# Patient Record
Sex: Female | Born: 1980 | Race: White | Hispanic: No | Marital: Married | Smoking: Never smoker
Health system: Southern US, Community
[De-identification: ages and names within clinical notes are randomized; demographics above are authoritative.]

## PROBLEM LIST (undated history)

## (undated) ENCOUNTER — Inpatient Hospital Stay (HOSPITAL_COMMUNITY): Payer: Self-pay

## (undated) DIAGNOSIS — O99280 Endocrine, nutritional and metabolic diseases complicating pregnancy, unspecified trimester: Secondary | ICD-10-CM

## (undated) DIAGNOSIS — J0100 Acute maxillary sinusitis, unspecified: Secondary | ICD-10-CM

## (undated) DIAGNOSIS — J45909 Unspecified asthma, uncomplicated: Secondary | ICD-10-CM

## (undated) DIAGNOSIS — O34219 Maternal care for unspecified type scar from previous cesarean delivery: Secondary | ICD-10-CM

## (undated) DIAGNOSIS — N979 Female infertility, unspecified: Secondary | ICD-10-CM

## (undated) DIAGNOSIS — I639 Cerebral infarction, unspecified: Secondary | ICD-10-CM

## (undated) DIAGNOSIS — IMO0001 Reserved for inherently not codable concepts without codable children: Secondary | ICD-10-CM

## (undated) DIAGNOSIS — E039 Hypothyroidism, unspecified: Secondary | ICD-10-CM

## (undated) HISTORY — DX: Cerebral infarction, unspecified: I63.9

## (undated) HISTORY — PX: WISDOM TOOTH EXTRACTION: SHX21

## (undated) HISTORY — PX: HAND SURGERY: SHX662

---

## 1999-07-27 ENCOUNTER — Encounter (INDEPENDENT_AMBULATORY_CARE_PROVIDER_SITE_OTHER): Payer: Self-pay | Admitting: *Deleted

## 1999-07-27 ENCOUNTER — Ambulatory Visit (HOSPITAL_BASED_OUTPATIENT_CLINIC_OR_DEPARTMENT_OTHER): Admission: RE | Admit: 1999-07-27 | Discharge: 1999-07-27 | Payer: Self-pay | Admitting: Orthopedic Surgery

## 1999-08-25 ENCOUNTER — Other Ambulatory Visit: Admission: RE | Admit: 1999-08-25 | Discharge: 1999-08-25 | Payer: Self-pay | Admitting: Obstetrics and Gynecology

## 2000-01-20 ENCOUNTER — Emergency Department (HOSPITAL_COMMUNITY): Admission: EM | Admit: 2000-01-20 | Discharge: 2000-01-20 | Payer: Self-pay

## 2000-10-15 ENCOUNTER — Other Ambulatory Visit: Admission: RE | Admit: 2000-10-15 | Discharge: 2000-10-15 | Payer: Self-pay | Admitting: Obstetrics and Gynecology

## 2001-09-02 ENCOUNTER — Other Ambulatory Visit: Admission: RE | Admit: 2001-09-02 | Discharge: 2001-09-02 | Payer: Self-pay | Admitting: Obstetrics and Gynecology

## 2002-09-19 ENCOUNTER — Other Ambulatory Visit: Admission: RE | Admit: 2002-09-19 | Discharge: 2002-09-19 | Payer: Self-pay | Admitting: Obstetrics and Gynecology

## 2003-10-07 ENCOUNTER — Other Ambulatory Visit: Admission: RE | Admit: 2003-10-07 | Discharge: 2003-10-07 | Payer: Self-pay | Admitting: Obstetrics and Gynecology

## 2004-11-11 ENCOUNTER — Other Ambulatory Visit: Admission: RE | Admit: 2004-11-11 | Discharge: 2004-11-11 | Payer: Self-pay | Admitting: Obstetrics and Gynecology

## 2005-12-22 ENCOUNTER — Other Ambulatory Visit: Admission: RE | Admit: 2005-12-22 | Discharge: 2005-12-22 | Payer: Self-pay | Admitting: Obstetrics and Gynecology

## 2007-11-07 ENCOUNTER — Ambulatory Visit (HOSPITAL_COMMUNITY): Admission: RE | Admit: 2007-11-07 | Discharge: 2007-11-07 | Payer: Self-pay | Admitting: Obstetrics & Gynecology

## 2008-05-01 ENCOUNTER — Observation Stay (HOSPITAL_COMMUNITY): Admission: AD | Admit: 2008-05-01 | Discharge: 2008-05-02 | Payer: Self-pay | Admitting: Obstetrics & Gynecology

## 2008-05-02 ENCOUNTER — Encounter (INDEPENDENT_AMBULATORY_CARE_PROVIDER_SITE_OTHER): Payer: Self-pay | Admitting: Obstetrics & Gynecology

## 2009-05-27 ENCOUNTER — Inpatient Hospital Stay (HOSPITAL_COMMUNITY): Admission: AD | Admit: 2009-05-27 | Discharge: 2009-05-27 | Payer: Self-pay | Admitting: Obstetrics and Gynecology

## 2009-06-04 ENCOUNTER — Inpatient Hospital Stay (HOSPITAL_COMMUNITY): Admission: RE | Admit: 2009-06-04 | Discharge: 2009-06-07 | Payer: Self-pay | Admitting: Obstetrics & Gynecology

## 2009-06-10 ENCOUNTER — Ambulatory Visit: Payer: Self-pay | Admitting: Pediatrics

## 2010-07-13 LAB — CROSSMATCH
ABO/RH(D): O POS
Antibody Screen: NEGATIVE

## 2010-07-13 LAB — CBC
MCHC: 33.9 g/dL (ref 30.0–36.0)
Platelets: 142 10*3/uL — ABNORMAL LOW (ref 150–400)
Platelets: 193 10*3/uL (ref 150–400)
RBC: 3.26 MIL/uL — ABNORMAL LOW (ref 3.87–5.11)
RDW: 15 % (ref 11.5–15.5)
WBC: 9.9 10*3/uL (ref 4.0–10.5)

## 2010-07-13 LAB — RPR: RPR Ser Ql: NONREACTIVE

## 2010-07-13 LAB — ABO/RH: ABO/RH(D): O POS

## 2010-08-08 LAB — LUPUS ANTICOAGULANT PANEL: Lupus Anticoagulant: NOT DETECTED

## 2010-08-08 LAB — ANTIPHOSPHOLIPID SYNDROME EVAL, BLD
Anticardiolipin IgG: <7 [GPL'U] — ABNORMAL LOW (ref <11)
Anticardiolipin IgM: <7 [MPL'U] — ABNORMAL LOW (ref <10)
Antiphosphatidylserine IgA: <20 APS U/mL (ref <20.0)
Antiphosphatidylserine IgG: <11 GPS U/mL (ref <11.0)
Lupus Anticoagulant: NOT DETECTED

## 2010-08-08 LAB — RPR: RPR Ser Ql: NONREACTIVE

## 2010-08-08 LAB — TORCH-IGM(TOXO/ RUB/ CMV/ HSV) W TITER
CMV IgM: 0.71 IV
Toxoplasma IgM: 0.34 IV

## 2010-08-08 LAB — CBC
HCT: 31.6 % — ABNORMAL LOW (ref 36.0–46.0)
MCV: 94.4 fL (ref 78.0–100.0)
Platelets: 250 10*3/uL (ref 150–400)
RDW: 12.9 % (ref 11.5–15.5)
WBC: 8.9 10*3/uL (ref 4.0–10.5)

## 2010-08-08 LAB — ANA: Anti Nuclear Antibody(ANA): POSITIVE — AB

## 2010-08-08 LAB — ANTI-NUCLEAR AB-TITER (ANA TITER): ANA Titer 1: NEGATIVE

## 2010-08-08 LAB — TISSUE HYBRIDIZATION TO NCBH

## 2010-09-09 NOTE — Op Note (Signed)
[place]. [facility]  Patient:    Krista Welch                       MRN: [id number] Proc. Date: 07/27/99 Adm. Date:  [id number] Attending:  Marlowe Shores                           Operative Report  PREOPERATIVE DIAGNOSIS:  Dorsal mass, right hand.  POSTOPERATIVE DIAGNOSIS:  Dorsal mass, right hand.  OPERATION:  Excision ganglion cyst, dorsal aspect of the right hand from the extensor tendon of the ring finger extensor digitorum communis.  SURGEON:  Artist Pais. Mina Marble, M.D.  ASSISTANT:  Junius Roads. Ireton, P.A.C.  ANESTHESIA:  Bier block.  TOURNIQUET TIME:  30 minutes.  COMPLICATIONS:  None.  DRAINS:  None.  SPECIMENS:  1 specimen sent.  DESCRIPTION OF PROCEDURE:  The patient was taken to the operating room, and after the induction of adequate bier block analgesia, the right upper extremity was prepped and draped in the usual sterile fashion.   Once this was done, a 2 cm transverse incision was made over a subcutaneous mass over the dorsal ulnar aspect of the right hand in the area of the fourth metacarpal ray.  The incision was taken down through the skin and subcutaneous tissues with care to carefully identify and retract small veins and branch of ulnar cutaneous nerve.  Once this was done, the cyst was encountered.  The cyst was actually involving the [facility] tendon to the ring finger primarily.  It was ______ .  Careful dissection was carried down into the core of the tendon, and the cyst itself was excised.  There was a small longitudinal rent in the tendon itself but was deemed nonrepairable and quite stable.  The wound was then thoroughly irrigated.  Hemostasis was achieved with  bipolar cautery, and the wound was then closed with running 3-0 Prolene subcuticular stitch. Xeroform, 4x4 fluffs, and a compressive dressing as well as a volar splint was applied.  The patient tolerated the procedure well and went to the recovery room  in stable fashion. DD:  07/27/99 TD:  07/27/99 Job: [id number]

## 2011-04-25 HISTORY — DX: Maternal care for unspecified type scar from previous cesarean delivery: O34.219

## 2011-04-25 NOTE — L&D Delivery Note (Signed)
Delivery Note At 10:43 AM with controlled pushing a  viable and healthy female was delivered via VBAC, Spontaneous (Presentation: ; Occiput Posterior).  APGAR: 9, 9; weight 8 lb 0.8 oz (3650 g).   Placenta status: Intact, Spontaneous.  Cord: 3 vessels with the following complications: None.  Cord pH: N/A.   Anesthesia: Epidural  Episiotomy: None Lacerations: Bilateral vaginal sulcal tears and bilateral labial tears and 2nd degree perineal laceration.  Suture Repair: 2.0 3.0 vicryl rapide Est. Blood Loss (mL): 400  Mom to postpartum.  Baby to with mother.  Jefrey Raburn R 01/23/2012, 12:13 PM

## 2011-07-13 LAB — OB RESULTS CONSOLE RUBELLA ANTIBODY, IGM: Rubella: IMMUNE

## 2011-07-13 LAB — OB RESULTS CONSOLE ABO/RH: RH Type: POSITIVE

## 2011-07-13 LAB — OB RESULTS CONSOLE HEPATITIS B SURFACE ANTIGEN: Hepatitis B Surface Ag: NEGATIVE

## 2011-11-30 ENCOUNTER — Inpatient Hospital Stay (HOSPITAL_COMMUNITY): Payer: BC Managed Care – PPO

## 2011-11-30 ENCOUNTER — Inpatient Hospital Stay (HOSPITAL_COMMUNITY)
Admission: AD | Admit: 2011-11-30 | Discharge: 2011-12-01 | Disposition: A | Discharge status: Home / Self Care | Payer: BC Managed Care – PPO | Source: Ambulatory Visit | Attending: Obstetrics & Gynecology | Admitting: Obstetrics & Gynecology

## 2011-11-30 ENCOUNTER — Encounter (HOSPITAL_COMMUNITY): Payer: Self-pay | Admitting: *Deleted

## 2011-11-30 DIAGNOSIS — R52 Pain, unspecified: Secondary | ICD-10-CM

## 2011-11-30 DIAGNOSIS — O4700 False labor before 37 completed weeks of gestation, unspecified trimester: Secondary | ICD-10-CM | POA: Insufficient documentation

## 2011-11-30 MED ORDER — DEXTROSE 5 % IN LACTATED RINGERS IV BOLUS: 500.0000 mL | Freq: Once | INTRAVENOUS | Status: DC

## 2011-11-30 MED ORDER — LACTATED RINGERS IV SOLN
Freq: Once | INTRAVENOUS | Status: AC
  Administered 2011-11-30: 22:00:00 via INTRAVENOUS

## 2011-11-30 MED ORDER — TERBUTALINE SULFATE 1 MG/ML IJ SOLN
0.2500 mg | Freq: Once | INTRAMUSCULAR | Status: AC
  Administered 2011-11-30: 0.25 mg via SUBCUTANEOUS
  Filled 2011-11-30: qty 1

## 2011-11-30 NOTE — MAU Note (Signed)
Pt G3 P1 at 30.2wks having contractions. Denies bleeding or leaking fluid.

## 2011-11-30 NOTE — MAU Provider Note (Addendum)
  History    CSN: [id number]  Arrival date and time: 11/30/11 2103   None    Chief Complaint  Patient presents with  . Contractions   HPI 31 yo, [id number] (twin loss at 19 wks, 1 SAB, then term c-s for previa), IVF pregnancy, 1st trim bleeding and SCH, no problems since then.  C/o uterine contraction not improved with rest and hydration started after almost fell at home, but caught fall. No vaginal leaking/ bleeding/ pressure. Good FMs.   Past Medical History  Diagnosis Date  . No pertinent past medical history   . Gestational diabetes    Past Surgical History  Procedure Date  . Cesarean section   . Hand surgery    No family history on file.  History  Substance Use Topics  . Smoking status: Not on file  . Smokeless tobacco: Not on file  . Alcohol Use:     Allergies: No Known Allergies  Prescriptions prior to admission  Medication Sig Dispense Refill  . cetirizine (ZYRTEC) 10 MG tablet Take 10 mg by mouth daily.      Marland Kitchen levothyroxine (SYNTHROID, LEVOTHROID) 25 MCG tablet Take 25 mcg by mouth every morning.      . Prenatal Vit-Fe Fumarate-FA (PRENATAL MULTIVITAMIN) TABS Take 1 tablet by mouth at bedtime.        ROS Physical Exam   Blood pressure 122/65, pulse 82, temperature 97.9 F (36.6 C), temperature source Oral, resp. rate 18, height 5\' 4"  (1.626 m), weight 174 lb 6.4 oz (79.107 kg).  Physical Exam A&O x 3, no acute distress. Pleasant HEENT neg, no thyromegaly Lungs CTA bilat CV RRR, S1S2 normal Abdo soft, non tender, non acute Extr no edema/ tenderness Pelvic Cx closed, long FHT  140s/ + accels/ no decels/ variab minimal, ok for 30 wks.  Toco noted at admission, resolved since terb inj  MAU Course  Procedures Sono - Cervical length - nl 4.9 cm, no funeling.   Assessment and Plan  30.2/7 wks, preterm uterine contractions. IV fluids, terbutaline x1 dose.  Sono cervical length - nl F/up office next wk, PTL precautions, Procardia prn.   Krista Welch  R 11/30/2011, 11:24 PM

## 2011-11-30 NOTE — MAU Note (Signed)
Patient denies contractions at this time, taken to ultrasound via wheelchair

## 2011-12-01 MED ORDER — NIFEDIPINE 10 MG PO CAPS: 10.0000 mg | ORAL_CAPSULE | Freq: Three times a day (TID) | ORAL | Status: DC

## 2011-12-06 ENCOUNTER — Encounter: Payer: BC Managed Care – PPO | Attending: Obstetrics & Gynecology | Admitting: *Deleted

## 2011-12-06 VITALS — Ht 65.0 in | Wt 174.5 lb

## 2011-12-06 DIAGNOSIS — O24419 Gestational diabetes mellitus in pregnancy, unspecified control: Secondary | ICD-10-CM

## 2011-12-06 DIAGNOSIS — O99810 Abnormal glucose complicating pregnancy: Secondary | ICD-10-CM | POA: Insufficient documentation

## 2011-12-06 DIAGNOSIS — Z713 Dietary counseling and surveillance: Secondary | ICD-10-CM | POA: Insufficient documentation

## 2011-12-07 ENCOUNTER — Encounter: Payer: Self-pay | Admitting: *Deleted

## 2011-12-07 NOTE — Patient Instructions (Signed)
Goals:  Check glucose levels per MD as instructed  Follow Gestational Diabetes Diet as instructed  Call for follow-up as needed    

## 2011-12-07 NOTE — Progress Notes (Signed)
  Patient was seen on 12/06/11 for Gestational Diabetes self-management class at the [facility]. The following learning objectives were met by the patient during this course:   States the definition of Gestational Diabetes  States why dietary management is important in controlling blood glucose  Describes the effects each nutrient has on blood glucose levels  Demonstrates ability to create a balanced meal plan  Demonstrates carbohydrate counting   States when to check blood glucose levels  Demonstrates proper blood glucose monitoring techniques  States the effect of stress and exercise on blood glucose levels  States the importance of limiting caffeine and abstaining from alcohol and smoking  Blood glucose monitor given:  One Touch Ultra Mini Self Monitoring Kit Lot # [id number] Exp: 4/14 Blood glucose reading: 111 mg/dl  Patient instructed to monitor glucose levels: FBS: 60 - <90 2 hour: <120  *Patient received handouts:  Nutrition Diabetes and Pregnancy  Carbohydrate Counting List  Patient will be seen for follow-up as needed.   

## 2012-01-01 LAB — OB RESULTS CONSOLE GBS: GBS: NEGATIVE

## 2012-01-23 ENCOUNTER — Inpatient Hospital Stay (HOSPITAL_COMMUNITY)
Admission: AD | Admit: 2012-01-23 | Discharge: 2012-01-24 | DRG: 372 | Disposition: A | Discharge status: Home / Self Care | Payer: BC Managed Care – PPO | Source: Ambulatory Visit | Attending: Obstetrics & Gynecology | Admitting: Obstetrics & Gynecology

## 2012-01-23 ENCOUNTER — Encounter (HOSPITAL_COMMUNITY): Payer: Self-pay | Admitting: *Deleted

## 2012-01-23 ENCOUNTER — Encounter (HOSPITAL_COMMUNITY): Payer: Self-pay | Admitting: Anesthesiology

## 2012-01-23 ENCOUNTER — Inpatient Hospital Stay (HOSPITAL_COMMUNITY): Payer: BC Managed Care – PPO | Admitting: Anesthesiology

## 2012-01-23 DIAGNOSIS — O24410 Gestational diabetes mellitus in pregnancy, diet controlled: Secondary | ICD-10-CM | POA: Diagnosis present

## 2012-01-23 DIAGNOSIS — E079 Disorder of thyroid, unspecified: Secondary | ICD-10-CM | POA: Diagnosis present

## 2012-01-23 DIAGNOSIS — O99280 Endocrine, nutritional and metabolic diseases complicating pregnancy, unspecified trimester: Secondary | ICD-10-CM

## 2012-01-23 DIAGNOSIS — IMO0001 Reserved for inherently not codable concepts without codable children: Secondary | ICD-10-CM

## 2012-01-23 DIAGNOSIS — E039 Hypothyroidism, unspecified: Secondary | ICD-10-CM

## 2012-01-23 DIAGNOSIS — O99814 Abnormal glucose complicating childbirth: Secondary | ICD-10-CM | POA: Diagnosis present

## 2012-01-23 DIAGNOSIS — O3660X0 Maternal care for excessive fetal growth, unspecified trimester, not applicable or unspecified: Secondary | ICD-10-CM | POA: Diagnosis present

## 2012-01-23 DIAGNOSIS — O34219 Maternal care for unspecified type scar from previous cesarean delivery: Secondary | ICD-10-CM | POA: Diagnosis present

## 2012-01-23 HISTORY — DX: Reserved for inherently not codable concepts without codable children: IMO0001

## 2012-01-23 HISTORY — DX: Female infertility, unspecified: N97.9

## 2012-01-23 HISTORY — DX: Acute maxillary sinusitis, unspecified: J01.00

## 2012-01-23 HISTORY — DX: Hypothyroidism, unspecified: E03.9

## 2012-01-23 HISTORY — DX: Endocrine, nutritional and metabolic diseases complicating pregnancy, unspecified trimester: O99.280

## 2012-01-23 HISTORY — DX: Unspecified asthma, uncomplicated: J45.909

## 2012-01-23 HISTORY — DX: Hypothyroidism, unspecified: O99.280

## 2012-01-23 LAB — CBC
MCH: 31.1 pg (ref 26.0–34.0)
MCV: 90.7 fL (ref 78.0–100.0)
Platelets: 160 10*3/uL (ref 150–400)
RDW: 13.9 % (ref 11.5–15.5)
WBC: 11 10*3/uL — ABNORMAL HIGH (ref 4.0–10.5)

## 2012-01-23 LAB — GLUCOSE, CAPILLARY: Glucose-Capillary: 75 mg/dL (ref 70–99)

## 2012-01-23 LAB — TYPE AND SCREEN: ABO/RH(D): O POS

## 2012-01-23 MED ORDER — DIPHENHYDRAMINE HCL 25 MG PO CAPS: 25.0000 mg | ORAL_CAPSULE | Freq: Four times a day (QID) | ORAL | Status: DC | PRN

## 2012-01-23 MED ORDER — OXYTOCIN 40 UNITS IN LACTATED RINGERS INFUSION - SIMPLE MED
1.0000 m[IU]/min | INTRAVENOUS | Status: DC
  Administered 2012-01-23: 2 m[IU]/min via INTRAVENOUS
  Administered 2012-01-23: 666 m[IU]/min via INTRAVENOUS
  Filled 2012-01-23: qty 1000

## 2012-01-23 MED ORDER — LACTATED RINGERS IV SOLN: INTRAVENOUS | Status: DC

## 2012-01-23 MED ORDER — IBUPROFEN 600 MG PO TABS: 600.0000 mg | ORAL_TABLET | Freq: Four times a day (QID) | ORAL | Status: DC | PRN

## 2012-01-23 MED ORDER — LEVOTHYROXINE SODIUM 25 MCG PO TABS
25.0000 ug | ORAL_TABLET | Freq: Every morning | ORAL | Status: DC
  Administered 2012-01-24: 25 ug via ORAL
  Filled 2012-01-23 (×3): qty 1

## 2012-01-23 MED ORDER — SENNOSIDES-DOCUSATE SODIUM 8.6-50 MG PO TABS
2.0000 | ORAL_TABLET | Freq: Every day | ORAL | Status: DC
  Administered 2012-01-23: 2 via ORAL

## 2012-01-23 MED ORDER — CITRIC ACID-SODIUM CITRATE 334-500 MG/5ML PO SOLN: 30.0000 mL | ORAL | Status: DC | PRN

## 2012-01-23 MED ORDER — WITCH HAZEL-GLYCERIN EX PADS
1.0000 "application " | MEDICATED_PAD | CUTANEOUS | Status: DC | PRN
  Administered 2012-01-23: 1 via TOPICAL

## 2012-01-23 MED ORDER — FLEET ENEMA 7-19 GM/118ML RE ENEM: 1.0000 | ENEMA | Freq: Once | RECTAL | Status: DC

## 2012-01-23 MED ORDER — ACETAMINOPHEN 325 MG PO TABS: 650.0000 mg | ORAL_TABLET | ORAL | Status: DC | PRN

## 2012-01-23 MED ORDER — ZOLPIDEM TARTRATE 5 MG PO TABS: 5.0000 mg | ORAL_TABLET | Freq: Every evening | ORAL | Status: DC | PRN

## 2012-01-23 MED ORDER — PRENATAL MULTIVITAMIN CH
1.0000 | ORAL_TABLET | Freq: Every day | ORAL | Status: DC
  Administered 2012-01-24: 1 via ORAL
  Filled 2012-01-23: qty 1

## 2012-01-23 MED ORDER — ONDANSETRON HCL 4 MG/2ML IJ SOLN: 4.0000 mg | Freq: Four times a day (QID) | INTRAMUSCULAR | Status: DC | PRN

## 2012-01-23 MED ORDER — LIDOCAINE HCL (PF) 1 % IJ SOLN
INTRAMUSCULAR | Status: DC | PRN
  Administered 2012-01-23 (×2): 4 mL
  Administered 2012-01-23: 30 mL

## 2012-01-23 MED ORDER — TERBUTALINE SULFATE 1 MG/ML IJ SOLN: 0.2500 mg | Freq: Once | INTRAMUSCULAR | Status: DC | PRN

## 2012-01-23 MED ORDER — OXYCODONE-ACETAMINOPHEN 5-325 MG PO TABS
1.0000 | ORAL_TABLET | ORAL | Status: DC | PRN
  Administered 2012-01-23 (×2): 1 via ORAL
  Administered 2012-01-24: 2 via ORAL
  Filled 2012-01-23: qty 1
  Filled 2012-01-23: qty 2
  Filled 2012-01-23: qty 1

## 2012-01-23 MED ORDER — BENZOCAINE-MENTHOL 20-0.5 % EX AERO
1.0000 "application " | INHALATION_SPRAY | CUTANEOUS | Status: DC | PRN
  Administered 2012-01-23: 1 via TOPICAL
  Filled 2012-01-23: qty 56

## 2012-01-23 MED ORDER — ONDANSETRON HCL 4 MG PO TABS: 4.0000 mg | ORAL_TABLET | ORAL | Status: DC | PRN

## 2012-01-23 MED ORDER — OXYTOCIN 40 UNITS IN LACTATED RINGERS INFUSION - SIMPLE MED: 62.5000 mL/h | Freq: Once | INTRAVENOUS | Status: DC

## 2012-01-23 MED ORDER — OXYTOCIN BOLUS FROM INFUSION
500.0000 mL | Freq: Once | INTRAVENOUS | Status: DC
  Filled 2012-01-23: qty 500

## 2012-01-23 MED ORDER — IBUPROFEN 600 MG PO TABS
600.0000 mg | ORAL_TABLET | Freq: Four times a day (QID) | ORAL | Status: DC | PRN
  Administered 2012-01-23: 600 mg via ORAL
  Filled 2012-01-23: qty 1

## 2012-01-23 MED ORDER — LIDOCAINE HCL (PF) 1 % IJ SOLN
30.0000 mL | INTRAMUSCULAR | Status: DC | PRN
  Filled 2012-01-23: qty 30

## 2012-01-23 MED ORDER — LIDOCAINE HCL (PF) 1 % IJ SOLN: 30.0000 mL | INTRAMUSCULAR | Status: DC | PRN

## 2012-01-23 MED ORDER — DIPHENHYDRAMINE HCL 50 MG/ML IJ SOLN: 12.5000 mg | INTRAMUSCULAR | Status: DC | PRN

## 2012-01-23 MED ORDER — LACTATED RINGERS IV SOLN
500.0000 mL | Freq: Once | INTRAVENOUS | Status: AC
  Administered 2012-01-23: 500 mL via INTRAVENOUS

## 2012-01-23 MED ORDER — EPHEDRINE 5 MG/ML INJ
10.0000 mg | INTRAVENOUS | Status: DC | PRN
  Filled 2012-01-23: qty 4

## 2012-01-23 MED ORDER — OXYCODONE-ACETAMINOPHEN 5-325 MG PO TABS: 1.0000 | ORAL_TABLET | ORAL | Status: DC | PRN

## 2012-01-23 MED ORDER — PHENYLEPHRINE 40 MCG/ML (10ML) SYRINGE FOR IV PUSH (FOR BLOOD PRESSURE SUPPORT)
80.0000 ug | PREFILLED_SYRINGE | INTRAVENOUS | Status: DC | PRN
  Filled 2012-01-23: qty 5

## 2012-01-23 MED ORDER — LANOLIN HYDROUS EX OINT: TOPICAL_OINTMENT | CUTANEOUS | Status: DC | PRN

## 2012-01-23 MED ORDER — ONDANSETRON HCL 4 MG/2ML IJ SOLN: 4.0000 mg | INTRAMUSCULAR | Status: DC | PRN

## 2012-01-23 MED ORDER — FENTANYL 2.5 MCG/ML BUPIVACAINE 1/10 % EPIDURAL INFUSION (WH - ANES)
14.0000 mL/h | INTRAMUSCULAR | Status: DC
  Administered 2012-01-23 (×2): 14 mL/h via EPIDURAL
  Filled 2012-01-23 (×3): qty 60

## 2012-01-23 MED ORDER — TETANUS-DIPHTH-ACELL PERTUSSIS 5-2.5-18.5 LF-MCG/0.5 IM SUSP
0.5000 mL | Freq: Once | INTRAMUSCULAR | Status: AC
  Administered 2012-01-24: 0.5 mL via INTRAMUSCULAR
  Filled 2012-01-23: qty 0.5

## 2012-01-23 MED ORDER — LACTATED RINGERS IV SOLN
INTRAVENOUS | Status: DC
  Administered 2012-01-23: 09:00:00 via INTRAVENOUS

## 2012-01-23 MED ORDER — SIMETHICONE 80 MG PO CHEW: 80.0000 mg | CHEWABLE_TABLET | ORAL | Status: DC | PRN

## 2012-01-23 MED ORDER — DIBUCAINE 1 % RE OINT
1.0000 "application " | TOPICAL_OINTMENT | RECTAL | Status: DC | PRN
  Administered 2012-01-23: 1 via RECTAL
  Filled 2012-01-23: qty 28

## 2012-01-23 MED ORDER — EPHEDRINE 5 MG/ML INJ: 10.0000 mg | INTRAVENOUS | Status: DC | PRN

## 2012-01-23 MED ORDER — LORATADINE 10 MG PO TABS
10.0000 mg | ORAL_TABLET | Freq: Every day | ORAL | Status: DC
Start: 2012-01-23 — End: 2012-01-24
  Administered 2012-01-24: 10 mg via ORAL
  Filled 2012-01-23 (×3): qty 1

## 2012-01-23 MED ORDER — LACTATED RINGERS IV SOLN: 500.0000 mL | INTRAVENOUS | Status: DC | PRN

## 2012-01-23 MED ORDER — PHENYLEPHRINE 40 MCG/ML (10ML) SYRINGE FOR IV PUSH (FOR BLOOD PRESSURE SUPPORT): 80.0000 ug | PREFILLED_SYRINGE | INTRAVENOUS | Status: DC | PRN

## 2012-01-23 MED ORDER — IBUPROFEN 600 MG PO TABS
600.0000 mg | ORAL_TABLET | Freq: Four times a day (QID) | ORAL | Status: DC
  Administered 2012-01-23 – 2012-01-24 (×4): 600 mg via ORAL
  Filled 2012-01-23 (×4): qty 1

## 2012-01-23 MED ORDER — FENTANYL 2.5 MCG/ML BUPIVACAINE 1/10 % EPIDURAL INFUSION (WH - ANES)
INTRAMUSCULAR | Status: DC | PRN
  Administered 2012-01-23: 14 mL/h via EPIDURAL

## 2012-01-23 NOTE — Anesthesia Postprocedure Evaluation (Signed)
  Anesthesia Post-op Note  Patient: Krista Welch  Procedure(s) Performed: * No procedures listed *  Patient Location: Mother/Baby  Anesthesia Type: Epidural  Level of Consciousness: awake, alert  and oriented  Airway and Oxygen Therapy: Patient Spontanous Breathing  Post-op Pain: none  Post-op Assessment: Post-op Vital signs reviewed and Patient's Cardiovascular Status Stable  Post-op Vital Signs: Reviewed and stable  Complications: No apparent anesthesia complications

## 2012-01-23 NOTE — Progress Notes (Signed)
Krista Welch is a 31 y.o. G4P1 at [redacted]w[redacted]d by LMP c/w 1st trim sono. Presenting with contractions and slight bleeding. No fluid leaking. Good FMs.  ROS neg S/p Epidural, is comfortable.   Objective: BP 113/65  Pulse 66  Temp 97.5 F (36.4 C) (Oral)  Resp 20  Ht 5\' 4"  (1.626 m)  Wt 182 lb (82.555 kg)  BMI 31.24 kg/m2  SpO2 97%   FHT:  FHR: 140s bpm, variability: moderate,  accelerations:  Present,  decelerations:  Absent UC:   irregular, every 3-5 minutes SVE: 3/70%/-3/ Vtx. Controlled AROM with fundal pressure and small amniotomy performed. Small amount of clear fluid and bloody show noted.   Labs: Lab Results  Component Value Date   WBC 11.0* 01/23/2012   HGB 12.7 01/23/2012   HCT 37.0 01/23/2012   MCV 90.7 01/23/2012   PLT 160 01/23/2012    Assessment / Plan:  Labor: Spontaneous early labor, contractions spaced out. Reassess pattern with AROM and start low dose pitocin if needed.  Understands slightly higher risk of uterine scar rupture with labor augmentation from 0.5% to 1-2%. Agrees and prefers VTOL.  VTOL desired with not prior term delivery but 19 wk twin loss after 1st c/s. High station with ballotable head is concerning but continue to monitor progress.  FHT- category I GBS neg.  Krista Welch R 01/23/2012, 6:38 AM

## 2012-01-23 NOTE — MAU Note (Signed)
Contractions, denies bleeding or ROM 

## 2012-01-23 NOTE — Progress Notes (Signed)
Krista Welch is a 31 y.o. G4P1 at [redacted]w[redacted]d, spontaneous labor this morning, but UCs spaced out after epidural and AROM has not helped with spontaneous contractions.  FHT category I. Pt feels well, plan to start low dose pitocin to assist labor augmentation. Agrees.    Krista Welch 01/23/2012, 10:10 AM

## 2012-01-23 NOTE — Anesthesia Procedure Notes (Signed)
Epidural Patient location during procedure: OB Start time: 01/23/2012 3:38 AM  Staffing Anesthesiologist: Brena Windsor A. Performed by: anesthesiologist   Preanesthetic Checklist Completed: patient identified, site marked, surgical consent, pre-op evaluation, timeout performed, IV checked, risks and benefits discussed and monitors and equipment checked  Epidural Patient position: sitting Prep: site prepped and draped and DuraPrep Patient monitoring: continuous pulse ox and blood pressure Approach: midline Injection technique: LOR air  Needle:  Needle type: Tuohy  Needle gauge: 17 G Needle length: 9 cm and 9 Needle insertion depth: 5 cm cm Catheter type: closed end flexible Catheter size: 19 Gauge Catheter at skin depth: 10 cm Test dose: negative and Other  Assessment Events: blood not aspirated, injection not painful, no injection resistance, negative IV test and no paresthesia  Additional Notes Patient identified. Risks and benefits discussed including failed block, incomplete  Pain control, post dural puncture headache, nerve damage, paralysis, blood pressure Changes, nausea, vomiting, reactions to medications-both toxic and allergic and post Partum back pain. All questions were answered. Patient expressed understanding and wished to proceed. Sterile technique was used throughout procedure. Epidural site was Dressed with sterile barrier dressing. No paresthesias, signs of intravascular injection Or signs of intrathecal spread were encountered.  Patient was more comfortable after the epidural was dosed. Please see RN's note for documentation of vital signs and FHR which are stable.

## 2012-01-23 NOTE — Anesthesia Preprocedure Evaluation (Signed)
Anesthesia Evaluation  Patient identified by MRN, date of birth, ID band Patient awake    Reviewed: Allergy & Precautions, H&P , Patient's Chart, lab work & pertinent test results  Airway Mallampati: III TM Distance: >3 FB Neck ROM: full    Dental No notable dental hx. (+) Teeth Intact   Pulmonary neg pulmonary ROS,  breath sounds clear to auscultation  Pulmonary exam normal       Cardiovascular negative cardio ROS  Rhythm:regular Rate:Normal     Neuro/Psych negative neurological ROS  negative psych ROS   GI/Hepatic negative GI ROS, Neg liver ROS,   Endo/Other  diabetes, Well Controlled, GestationalHypothyroidism   Renal/GU negative Renal ROS  negative genitourinary   Musculoskeletal   Abdominal Normal abdominal exam  (+)   Peds  Hematology negative hematology ROS (+)   Anesthesia Other Findings   Reproductive/Obstetrics (+) Pregnancy Previous C/Section for Placenta Previa                           Anesthesia Physical Anesthesia Plan  ASA: II  Anesthesia Plan: Epidural   Post-op Pain Management:    Induction:   Airway Management Planned:   Additional Equipment:   Intra-op Plan:   Post-operative Plan:   Informed Consent: I have reviewed the patients History and Physical, chart, labs and discussed the procedure including the risks, benefits and alternatives for the proposed anesthesia with the patient or authorized representative who has indicated his/her understanding and acceptance.     Plan Discussed with: Anesthesiologist  Anesthesia Plan Comments:         Anesthesia Quick Evaluation

## 2012-01-23 NOTE — H&P (Signed)
Krista Welch is a 31 y.o. female G4P1021 at 36 wks by IVF ET date c/w sono (prior twin loss at 19 wks, 1 SAB, then term c-s for partial previa).  This is IVF pregnancy, presenting with UCs since few hours, some bleeding but no loss of fluid. Good FMs.   [facility] (Dr Seymour Bars) since 8 wks.  1st trim bleeding and large subchorionic hemorrhage Hypothyroidism on Synthroid, well controlled GDM (A1) well controlled, last sono - LGA and increased AFI  Asthma, no exacerbation  History OB History    Grav Para Term Preterm Abortions TAB SAB Ect Mult Living   4 1        1      Past Medical History  Diagnosis Date  . Gestational diabetes   . Hypothyroid   . Asthma     allergy induced  . Sinusitis, acute maxillary   . Infertility, female    Past Surgical History  Procedure Date  . Cesarean section   . Hand surgery   . Wisdom tooth extraction    Family History: family history includes Diabetes in her father and Hypertension in her other. Social History:  reports that she has never smoked. She does not have any smokeless tobacco history on file. Her alcohol and drug histories not on file.   Prenatal Transfer Tool  Maternal Diabetes: Yes:  Diabetes Type:  Diet controlled Genetic Screening: Normal Maternal Ultrasounds/Referrals: Normal Fetal Ultrasounds or other Referrals:  None Maternal Substance Abuse:  No Significant Maternal Medications:  Synthroid, Citrazine,  Significant Maternal Lab Results:  None Other Comments:  IVF pregnancy  ROS neg except contractions.   Exam Physical Exam  Blood pressure 122/71, pulse 73, temperature 97.5 F (36.4 C), temperature source Oral, resp. rate 20, height 5\' 4"  (1.626 m), weight 182 lb (82.555 kg), SpO2 97.00%. A&O x 3, no acute distress. Pleasant HEENT neg, no thyromegaly Lungs CTA bilat CV RRR, A1S2 normal Abdo soft, non tender, non acute Extr no edema/ tenderness Pelvic 3-4/70%/-3 /Vtx per RN in MAU.  FHT  140s/ + accels/  no decels/ moderate variability- category I.  Toco q 3-5 min.   Prenatal labs: ABO, Rh: O/Positive/-- (03/21 0000) Antibody:  negative Rubella: Immune (03/21 0000) RPR: Nonreactive (03/21 0000)  HBsAg: Negative (03/21 0000)  HIV: Non-reactive (03/21 0000)  GBS: Negative (09/09 0000)  Genetic Screening - nl Ultrascreen and AFP 1.  Glucose screen- abn- GDM, diet controlled. Sono -Nl anatomy scan and interval growth  Assessment/Plan: 38 wks, prior c-section, in early labor, epidural desired. GBS negative. Desires VTOL. RIsks/complications of VTOL incl uterine scar rupture discussed, understands and agrees.     Antavious Spanos R 01/23/2012, 6:48 AM

## 2012-01-24 ENCOUNTER — Encounter (HOSPITAL_COMMUNITY): Payer: Self-pay | Admitting: *Deleted

## 2012-01-24 LAB — CBC
HCT: 24.9 % — ABNORMAL LOW (ref 36.0–46.0)
Hemoglobin: 8.4 g/dL — ABNORMAL LOW (ref 12.0–15.0)
MCH: 30.8 pg (ref 26.0–34.0)
MCHC: 33.7 g/dL (ref 30.0–36.0)

## 2012-01-24 NOTE — Progress Notes (Signed)
PPD 1 SVD  S:  Pt reports feeling well/ Tolerating po/ Voiding without problems/ No n/v/ Bleeding is light/ Pain controlled withibuprofen (OTC)  Newborn  Well, circ done   O:  A & O x 3 NAD/  Filed Vitals:   01/24/12 0535  BP: 87/51  Pulse: 80  Temp: 98.1 F (36.7 C)  Resp: 18    LABS:  Lab Results  Component Value Date   WBC 11.4* 01/24/2012   HGB 8.4* 01/24/2012   HCT 24.9* 01/24/2012   MCV 91.2 01/24/2012   PLT 125* 01/24/2012      Lungs: clear  Heart: rcr  Abdomen: soft  Perineum: intact  UH: 0/2 firm  Lochia: light  Extremities: normal    A/P: PPD # 1/ [id number]  Doing well  D/C home today

## 2012-01-24 NOTE — Discharge Summary (Signed)
Obstetric Discharge Summary Reason for Admission: onset of labor Prenatal Procedures: none Intrapartum Procedures: spontaneous vaginal delivery Postpartum Procedures: none Complications-Operative and Postpartum: none Hemoglobin  Date Value Range Status  01/24/2012 8.4* 12.0 - 15.0 g/dL Final     DELTA CHECK NOTED     REPEATED TO VERIFY     HCT  Date Value Range Status  01/24/2012 24.9* 36.0 - 46.0 % Final    Physical Exam:  General: alert Lochia: appropriate Uterine Fundus: firm Vulva: healing well DVT Evaluation: No evidence of DVT seen on physical exam.  Discharge Diagnoses: Term Pregnancy-delivered  Discharge Information: Date: 01/24/2012 Activity: unrestricted Diet: routine Medications: Ibuprofen Condition: stable Instructions: refer to practice specific booklet Discharge to: home Follow-up Information    Follow up with [name],MARIE-LYNE, MD. In 6 weeks.   Contact information:   Krista Welch [id number] [phone]          Newborn Data: Live born female  Birth Weight: 8 lb 0.8 oz (3650 g) APGAR: 9, 9  Home with mother.  Krista Welch,MARIE-LYNE 01/24/2012, 11:17 AM

## 2013-11-20 LAB — OB RESULTS CONSOLE RPR: RPR: NONREACTIVE

## 2013-11-20 LAB — OB RESULTS CONSOLE HEPATITIS B SURFACE ANTIGEN: Hepatitis B Surface Ag: NEGATIVE

## 2013-11-20 LAB — OB RESULTS CONSOLE HIV ANTIBODY (ROUTINE TESTING): HIV: NONREACTIVE

## 2013-11-20 LAB — OB RESULTS CONSOLE ANTIBODY SCREEN: Antibody Screen: NEGATIVE

## 2013-11-20 LAB — OB RESULTS CONSOLE ABO/RH: RH TYPE: POSITIVE

## 2013-11-20 LAB — OB RESULTS CONSOLE RUBELLA ANTIBODY, IGM: Rubella: IMMUNE

## 2014-01-01 LAB — OB RESULTS CONSOLE GC/CHLAMYDIA
Chlamydia: NEGATIVE
Gonorrhea: NEGATIVE

## 2014-01-07 LAB — OB RESULTS CONSOLE GBS: STREP GROUP B AG: POSITIVE

## 2014-02-23 ENCOUNTER — Encounter (HOSPITAL_COMMUNITY): Payer: Self-pay | Admitting: *Deleted

## 2014-06-01 ENCOUNTER — Other Ambulatory Visit: Payer: Self-pay | Admitting: Obstetrics & Gynecology

## 2014-06-02 ENCOUNTER — Other Ambulatory Visit: Payer: Self-pay | Admitting: Obstetrics & Gynecology

## 2014-06-04 ENCOUNTER — Encounter (HOSPITAL_COMMUNITY): Payer: Self-pay

## 2014-06-05 ENCOUNTER — Inpatient Hospital Stay (HOSPITAL_COMMUNITY): Admission: RE | Admit: 2014-06-05 | Payer: Self-pay | Source: Ambulatory Visit

## 2014-06-05 ENCOUNTER — Inpatient Hospital Stay (HOSPITAL_COMMUNITY)
Admission: AD | Admit: 2014-06-05 | Discharge: 2014-06-07 | DRG: 766 | Disposition: A | Discharge status: Home / Self Care | Payer: BLUE CROSS/BLUE SHIELD | Source: Ambulatory Visit | Attending: Obstetrics & Gynecology | Admitting: Obstetrics & Gynecology

## 2014-06-05 ENCOUNTER — Encounter (HOSPITAL_COMMUNITY): Payer: Self-pay | Admitting: *Deleted

## 2014-06-05 ENCOUNTER — Encounter (HOSPITAL_COMMUNITY)
Admission: AD | Disposition: A | Discharge status: Home / Self Care | Payer: Self-pay | Source: Ambulatory Visit | Attending: Obstetrics & Gynecology

## 2014-06-05 ENCOUNTER — Inpatient Hospital Stay (HOSPITAL_COMMUNITY): Payer: BLUE CROSS/BLUE SHIELD | Admitting: Anesthesiology

## 2014-06-05 DIAGNOSIS — O24429 Gestational diabetes mellitus in childbirth, unspecified control: Secondary | ICD-10-CM | POA: Diagnosis present

## 2014-06-05 DIAGNOSIS — Z3483 Encounter for supervision of other normal pregnancy, third trimester: Secondary | ICD-10-CM | POA: Diagnosis present

## 2014-06-05 DIAGNOSIS — O24410 Gestational diabetes mellitus in pregnancy, diet controlled: Secondary | ICD-10-CM | POA: Diagnosis present

## 2014-06-05 DIAGNOSIS — Z3A38 38 weeks gestation of pregnancy: Secondary | ICD-10-CM | POA: Diagnosis present

## 2014-06-05 DIAGNOSIS — O99824 Streptococcus B carrier state complicating childbirth: Secondary | ICD-10-CM | POA: Diagnosis present

## 2014-06-05 DIAGNOSIS — Z302 Encounter for sterilization: Secondary | ICD-10-CM | POA: Diagnosis not present

## 2014-06-05 DIAGNOSIS — O3421 Maternal care for scar from previous cesarean delivery: Secondary | ICD-10-CM | POA: Diagnosis present

## 2014-06-05 DIAGNOSIS — O09523 Supervision of elderly multigravida, third trimester: Secondary | ICD-10-CM

## 2014-06-05 DIAGNOSIS — Z9889 Other specified postprocedural states: Secondary | ICD-10-CM

## 2014-06-05 LAB — CBC
HCT: 40.3 % (ref 36.0–46.0)
HEMOGLOBIN: 14 g/dL (ref 12.0–15.0)
MCH: 32.3 pg (ref 26.0–34.0)
MCHC: 34.7 g/dL (ref 30.0–36.0)
MCV: 93.1 fL (ref 78.0–100.0)
Platelets: 176 10*3/uL (ref 150–400)
RBC: 4.33 MIL/uL (ref 3.87–5.11)
RDW: 14.3 % (ref 11.5–15.5)
WBC: 9 10*3/uL (ref 4.0–10.5)

## 2014-06-05 LAB — GLUCOSE, CAPILLARY: Glucose-Capillary: 79 mg/dL (ref 70–99)

## 2014-06-05 LAB — TYPE AND SCREEN
ABO/RH(D): O POS
Antibody Screen: NEGATIVE

## 2014-06-05 SURGERY — Surgical Case
Anesthesia: Spinal

## 2014-06-05 MED ORDER — CITRIC ACID-SODIUM CITRATE 334-500 MG/5ML PO SOLN
30.0000 mL | Freq: Once | ORAL | Status: AC
  Administered 2014-06-05: 30 mL via ORAL
  Filled 2014-06-05: qty 15

## 2014-06-05 MED ORDER — LACTATED RINGERS IV SOLN: INTRAVENOUS | Status: DC

## 2014-06-05 MED ORDER — NALOXONE HCL 1 MG/ML IJ SOLN: 1.0000 ug/kg/h | INTRAMUSCULAR | Status: DC | PRN

## 2014-06-05 MED ORDER — PHENYLEPHRINE 8 MG IN D5W 100 ML (0.08MG/ML) PREMIX OPTIME
INJECTION | INTRAVENOUS | Status: AC
  Filled 2014-06-05: qty 100

## 2014-06-05 MED ORDER — SIMETHICONE 80 MG PO CHEW
80.0000 mg | CHEWABLE_TABLET | ORAL | Status: DC
  Administered 2014-06-05: 80 mg via ORAL
  Filled 2014-06-05 (×2): qty 1

## 2014-06-05 MED ORDER — OXYTOCIN 10 UNIT/ML IJ SOLN
40.0000 [IU] | INTRAMUSCULAR | Status: DC | PRN
  Administered 2014-06-05: 40 [IU] via INTRAVENOUS

## 2014-06-05 MED ORDER — ONDANSETRON HCL 4 MG/2ML IJ SOLN: 4.0000 mg | Freq: Three times a day (TID) | INTRAMUSCULAR | Status: DC | PRN

## 2014-06-05 MED ORDER — MAGNESIUM HYDROXIDE 400 MG/5ML PO SUSP: 30.0000 mL | ORAL | Status: DC | PRN

## 2014-06-05 MED ORDER — SCOPOLAMINE 1 MG/3DAYS TD PT72
MEDICATED_PATCH | TRANSDERMAL | Status: DC | PRN
  Administered 2014-06-05: 1 via TRANSDERMAL

## 2014-06-05 MED ORDER — SCOPOLAMINE 1 MG/3DAYS TD PT72
1.0000 | MEDICATED_PATCH | Freq: Once | TRANSDERMAL | Status: DC
  Filled 2014-06-05: qty 1

## 2014-06-05 MED ORDER — NALBUPHINE HCL 10 MG/ML IJ SOLN: 5.0000 mg | Freq: Once | INTRAMUSCULAR | Status: AC | PRN

## 2014-06-05 MED ORDER — DIPHENHYDRAMINE HCL 25 MG PO CAPS: 25.0000 mg | ORAL_CAPSULE | ORAL | Status: DC | PRN

## 2014-06-05 MED ORDER — CEFAZOLIN SODIUM-DEXTROSE 2-3 GM-% IV SOLR
2.0000 g | Freq: Once | INTRAVENOUS | Status: AC
  Administered 2014-06-05 (×2): 2 g via INTRAVENOUS
  Filled 2014-06-05: qty 50

## 2014-06-05 MED ORDER — NALOXONE HCL 0.4 MG/ML IJ SOLN: 0.4000 mg | INTRAMUSCULAR | Status: DC | PRN

## 2014-06-05 MED ORDER — PRENATAL MULTIVITAMIN CH
1.0000 | ORAL_TABLET | Freq: Every day | ORAL | Status: DC
  Filled 2014-06-05: qty 1

## 2014-06-05 MED ORDER — PHENYLEPHRINE HCL 10 MG/ML IJ SOLN
INTRAMUSCULAR | Status: DC | PRN
  Administered 2014-06-05 (×2): 80 ug via INTRAVENOUS

## 2014-06-05 MED ORDER — MORPHINE SULFATE (PF) 0.5 MG/ML IJ SOLN
INTRAMUSCULAR | Status: DC | PRN
  Administered 2014-06-05: .2 mg via INTRATHECAL

## 2014-06-05 MED ORDER — SENNOSIDES-DOCUSATE SODIUM 8.6-50 MG PO TABS
2.0000 | ORAL_TABLET | ORAL | Status: DC
Start: 2014-06-06 — End: 2014-06-07
  Administered 2014-06-06 (×2): 2 via ORAL
  Filled 2014-06-05 (×2): qty 2

## 2014-06-05 MED ORDER — ACETAMINOPHEN 500 MG PO TABS
1000.0000 mg | ORAL_TABLET | Freq: Four times a day (QID) | ORAL | Status: AC
Start: 2014-06-05 — End: 2014-06-06
  Administered 2014-06-05 – 2014-06-06 (×3): 1000 mg via ORAL
  Filled 2014-06-05 (×3): qty 2

## 2014-06-05 MED ORDER — WITCH HAZEL-GLYCERIN EX PADS: 1.0000 "application " | MEDICATED_PAD | CUTANEOUS | Status: DC | PRN

## 2014-06-05 MED ORDER — PHENYLEPHRINE 8 MG IN D5W 100 ML (0.08MG/ML) PREMIX OPTIME
INJECTION | INTRAVENOUS | Status: DC | PRN
  Administered 2014-06-05: 60 ug/min via INTRAVENOUS

## 2014-06-05 MED ORDER — OXYCODONE-ACETAMINOPHEN 5-325 MG PO TABS
2.0000 | ORAL_TABLET | ORAL | Status: DC | PRN
  Administered 2014-06-07: 2 via ORAL
  Filled 2014-06-05: qty 2

## 2014-06-05 MED ORDER — KETOROLAC TROMETHAMINE 30 MG/ML IJ SOLN
30.0000 mg | Freq: Four times a day (QID) | INTRAMUSCULAR | Status: AC | PRN
  Administered 2014-06-05: 30 mg via INTRAMUSCULAR

## 2014-06-05 MED ORDER — FAMOTIDINE IN NACL 20-0.9 MG/50ML-% IV SOLN
20.0000 mg | Freq: Once | INTRAVENOUS | Status: AC
  Administered 2014-06-05: 20 mg via INTRAVENOUS
  Filled 2014-06-05: qty 50

## 2014-06-05 MED ORDER — DIBUCAINE 1 % RE OINT
1.0000 "application " | TOPICAL_OINTMENT | RECTAL | Status: DC | PRN
  Filled 2014-06-05: qty 28

## 2014-06-05 MED ORDER — FENTANYL CITRATE 0.05 MG/ML IJ SOLN
INTRAMUSCULAR | Status: DC | PRN
  Administered 2014-06-05: 10 ug via INTRATHECAL

## 2014-06-05 MED ORDER — SIMETHICONE 80 MG PO CHEW
80.0000 mg | CHEWABLE_TABLET | ORAL | Status: DC | PRN
  Administered 2014-06-06: 80 mg via ORAL

## 2014-06-05 MED ORDER — SIMETHICONE 80 MG PO CHEW
80.0000 mg | CHEWABLE_TABLET | Freq: Three times a day (TID) | ORAL | Status: DC
  Administered 2014-06-06 (×2): 80 mg via ORAL
  Filled 2014-06-05 (×4): qty 1

## 2014-06-05 MED ORDER — CEFAZOLIN SODIUM-DEXTROSE 2-3 GM-% IV SOLR
2.0000 g | INTRAVENOUS | Status: DC
  Filled 2014-06-05: qty 50

## 2014-06-05 MED ORDER — KETOROLAC TROMETHAMINE 30 MG/ML IJ SOLN
INTRAMUSCULAR | Status: AC
  Filled 2014-06-05: qty 1

## 2014-06-05 MED ORDER — PHENYLEPHRINE 40 MCG/ML (10ML) SYRINGE FOR IV PUSH (FOR BLOOD PRESSURE SUPPORT)
PREFILLED_SYRINGE | INTRAVENOUS | Status: AC
  Filled 2014-06-05: qty 10

## 2014-06-05 MED ORDER — BUPIVACAINE HCL (PF) 0.25 % IJ SOLN
INTRAMUSCULAR | Status: AC
  Filled 2014-06-05: qty 30

## 2014-06-05 MED ORDER — ONDANSETRON HCL 4 MG/2ML IJ SOLN
INTRAMUSCULAR | Status: DC | PRN
  Administered 2014-06-05: 4 mg via INTRAVENOUS

## 2014-06-05 MED ORDER — KETOROLAC TROMETHAMINE 30 MG/ML IJ SOLN
30.0000 mg | Freq: Four times a day (QID) | INTRAMUSCULAR | Status: AC | PRN
Start: 2014-06-05 — End: 2014-06-06

## 2014-06-05 MED ORDER — LANOLIN HYDROUS EX OINT: 1.0000 "application " | TOPICAL_OINTMENT | CUTANEOUS | Status: DC | PRN

## 2014-06-05 MED ORDER — TETANUS-DIPHTH-ACELL PERTUSSIS 5-2.5-18.5 LF-MCG/0.5 IM SUSP
0.5000 mL | Freq: Once | INTRAMUSCULAR | Status: DC
Start: 2014-06-06 — End: 2014-06-07

## 2014-06-05 MED ORDER — BUPIVACAINE IN DEXTROSE 0.75-8.25 % IT SOLN
INTRATHECAL | Status: DC | PRN
  Administered 2014-06-05: 1.6 mL via INTRATHECAL

## 2014-06-05 MED ORDER — OXYCODONE-ACETAMINOPHEN 5-325 MG PO TABS
1.0000 | ORAL_TABLET | ORAL | Status: DC | PRN
  Administered 2014-06-06 (×2): 1 via ORAL
  Filled 2014-06-05 (×2): qty 1

## 2014-06-05 MED ORDER — IBUPROFEN 600 MG PO TABS
600.0000 mg | ORAL_TABLET | Freq: Four times a day (QID) | ORAL | Status: DC
  Administered 2014-06-05 – 2014-06-07 (×7): 600 mg via ORAL
  Filled 2014-06-05 (×7): qty 1

## 2014-06-05 MED ORDER — NALBUPHINE HCL 10 MG/ML IJ SOLN
5.0000 mg | INTRAMUSCULAR | Status: DC | PRN
  Administered 2014-06-05: 5 mg via SUBCUTANEOUS

## 2014-06-05 MED ORDER — MEPERIDINE HCL 25 MG/ML IJ SOLN
6.2500 mg | INTRAMUSCULAR | Status: DC | PRN
Start: 2014-06-05 — End: 2014-06-05

## 2014-06-05 MED ORDER — BUPIVACAINE HCL (PF) 0.25 % IJ SOLN
INTRAMUSCULAR | Status: DC | PRN
  Administered 2014-06-05: 10 mL

## 2014-06-05 MED ORDER — ZOLPIDEM TARTRATE 5 MG PO TABS: 5.0000 mg | ORAL_TABLET | Freq: Every evening | ORAL | Status: DC | PRN

## 2014-06-05 MED ORDER — LACTATED RINGERS IV SOLN
INTRAVENOUS | Status: DC | PRN
  Administered 2014-06-05 (×2): via INTRAVENOUS

## 2014-06-05 MED ORDER — DIPHENHYDRAMINE HCL 50 MG/ML IJ SOLN: 12.5000 mg | INTRAMUSCULAR | Status: DC | PRN

## 2014-06-05 MED ORDER — FERROUS SULFATE 325 (65 FE) MG PO TABS
325.0000 mg | ORAL_TABLET | Freq: Two times a day (BID) | ORAL | Status: DC
  Filled 2014-06-05: qty 1

## 2014-06-05 MED ORDER — NALBUPHINE HCL 10 MG/ML IJ SOLN
INTRAMUSCULAR | Status: AC
  Filled 2014-06-05: qty 1

## 2014-06-05 MED ORDER — NALBUPHINE HCL 10 MG/ML IJ SOLN: 5.0000 mg | INTRAMUSCULAR | Status: DC | PRN

## 2014-06-05 MED ORDER — DIPHENHYDRAMINE HCL 25 MG PO CAPS: 25.0000 mg | ORAL_CAPSULE | Freq: Four times a day (QID) | ORAL | Status: DC | PRN

## 2014-06-05 MED ORDER — MENTHOL 3 MG MT LOZG: 1.0000 | LOZENGE | OROMUCOSAL | Status: DC | PRN

## 2014-06-05 MED ORDER — ONDANSETRON HCL 4 MG/2ML IJ SOLN: 4.0000 mg | Freq: Once | INTRAMUSCULAR | Status: DC | PRN

## 2014-06-05 MED ORDER — ONDANSETRON HCL 4 MG/2ML IJ SOLN: 4.0000 mg | INTRAMUSCULAR | Status: DC | PRN

## 2014-06-05 MED ORDER — SODIUM CHLORIDE 0.9 % IJ SOLN: 3.0000 mL | INTRAMUSCULAR | Status: DC | PRN

## 2014-06-05 MED ORDER — ONDANSETRON HCL 4 MG PO TABS: 4.0000 mg | ORAL_TABLET | ORAL | Status: DC | PRN

## 2014-06-05 MED ORDER — OXYTOCIN 40 UNITS IN LACTATED RINGERS INFUSION - SIMPLE MED: 62.5000 mL/h | INTRAVENOUS | Status: AC

## 2014-06-05 MED ORDER — SODIUM CHLORIDE 0.9 % IJ SOLN
INTRAMUSCULAR | Status: AC
  Filled 2014-06-05: qty 3

## 2014-06-05 MED ORDER — FENTANYL CITRATE 0.05 MG/ML IJ SOLN: 25.0000 ug | INTRAMUSCULAR | Status: DC | PRN

## 2014-06-05 SURGICAL SUPPLY — 40 items
CLAMP CORD UMBIL (MISCELLANEOUS) IMPLANT
CLOTH BEACON ORANGE TIMEOUT ST (SAFETY) ×3 IMPLANT
CONTAINER PREFILL 10% NBF 15ML (MISCELLANEOUS) IMPLANT
DRAPE SHEET LG 3/4 BI-LAMINATE (DRAPES) IMPLANT
DRSG OPSITE POSTOP 4X10 (GAUZE/BANDAGES/DRESSINGS) ×3 IMPLANT
DURAPREP 26ML APPLICATOR (WOUND CARE) ×3 IMPLANT
ELECT REM PT RETURN 9FT ADLT (ELECTROSURGICAL) ×3
ELECTRODE REM PT RTRN 9FT ADLT (ELECTROSURGICAL) ×1 IMPLANT
EXTRACTOR VACUUM M CUP 4 TUBE (SUCTIONS) IMPLANT
EXTRACTOR VACUUM M CUP 4' TUBE (SUCTIONS)
GLOVE BIO SURGEON STRL SZ 6.5 (GLOVE) ×2 IMPLANT
GLOVE BIO SURGEONS STRL SZ 6.5 (GLOVE) ×1
GLOVE BIOGEL PI IND STRL 7.0 (GLOVE) ×1 IMPLANT
GLOVE BIOGEL PI INDICATOR 7.0 (GLOVE) ×2
GOWN STRL REUS W/TWL LRG LVL3 (GOWN DISPOSABLE) ×6 IMPLANT
KIT ABG SYR 3ML LUER SLIP (SYRINGE) IMPLANT
LIQUID BAND (GAUZE/BANDAGES/DRESSINGS) ×2 IMPLANT
NDL HYPO 25X5/8 SAFETYGLIDE (NEEDLE) IMPLANT
NEEDLE HYPO 22GX1.5 SAFETY (NEEDLE) ×3 IMPLANT
NEEDLE HYPO 25X5/8 SAFETYGLIDE (NEEDLE) IMPLANT
PACK C SECTION WH (CUSTOM PROCEDURE TRAY) ×3 IMPLANT
PAD OB MATERNITY 4.3X12.25 (PERSONAL CARE ITEMS) ×3 IMPLANT
RTRCTR C-SECT PINK 25CM LRG (MISCELLANEOUS) ×3 IMPLANT
STAPLER VISISTAT 35W (STAPLE) IMPLANT
SUT MNCRL AB 3-0 PS2 27 (SUTURE) ×3 IMPLANT
SUT PLAIN 0 NONE (SUTURE) IMPLANT
SUT PLAIN 2 0 (SUTURE) ×3
SUT PLAIN ABS 2-0 CT1 27XMFL (SUTURE) ×1 IMPLANT
SUT VIC AB 0 CT1 27 (SUTURE) ×6
SUT VIC AB 0 CT1 27XBRD ANBCTR (SUTURE) ×2 IMPLANT
SUT VIC AB 0 CTX 36 (SUTURE) ×6
SUT VIC AB 0 CTX36XBRD ANBCTRL (SUTURE) ×2 IMPLANT
SUT VIC AB 2-0 CT1 27 (SUTURE) ×3
SUT VIC AB 2-0 CT1 TAPERPNT 27 (SUTURE) ×1 IMPLANT
SUT VIC AB 3-0 SH 27 (SUTURE)
SUT VIC AB 3-0 SH 27X BRD (SUTURE) IMPLANT
SUT VIC AB 4-0 PS2 27 (SUTURE) ×3 IMPLANT
SYR CONTROL 10ML LL (SYRINGE) ×3 IMPLANT
TOWEL OR 17X24 6PK STRL BLUE (TOWEL DISPOSABLE) ×3 IMPLANT
TRAY FOLEY CATH 14FR (SET/KITS/TRAYS/PACK) ×3 IMPLANT

## 2014-06-05 NOTE — Transfer of Care (Signed)
Immediate Anesthesia Transfer of Care Note  Patient: Krista Welch  Procedure(s) Performed: Procedure(s): CESAREAN SECTION (N/A)  Patient Location: PACU  Anesthesia Type:Spinal  Level of Consciousness: awake  Airway & Oxygen Therapy: Patient Spontanous Breathing  Post-op Assessment: Report given to RN  Post vital signs: Reviewed and stable  Last Vitals:  Filed Vitals:   06/05/14 1430  BP: 130/79  Pulse: 96  Temp: 36.9 C  Resp: 20    Complications: No apparent anesthesia complications

## 2014-06-05 NOTE — Anesthesia Procedure Notes (Signed)
Spinal Patient location during procedure: OR Start time: 06/05/2014 3:35 PM Staffing Anesthesiologist: Karie SchwalbeJUDD, Arsalan Brisbin JENNETTE Performed by: anesthesiologist  Preanesthetic Checklist Completed: patient identified, site marked, surgical consent, pre-op evaluation, timeout performed, IV checked, risks and benefits discussed and monitors and equipment checked Spinal Block Patient position: sitting Prep: ChloraPrep Patient monitoring: continuous pulse ox, blood pressure and heart rate Approach: midline Location: L3-4 Injection technique: single-shot Needle Needle type: Sprotte  Needle gauge: 24 G Needle length: 9 cm Additional Notes Functioning IV was confirmed and monitors were applied. Sterile prep and drape, including hand hygiene, mask and sterile gloves were used. The patient was positioned and the spine was prepped. The skin was anesthetized with lidocaine.  Free flow of clear CSF was obtained prior to injecting local anesthetic into the CSF.  The spinal needle aspirated freely following injection.  The needle was carefully withdrawn.  The patient tolerated the procedure well. Consent was obtained prior to procedure with all questions answered and concerns addressed. Risks including but not limited to bleeding, infection, nerve damage, paralysis, failed block, inadequate analgesia, allergic reaction, high spinal, itching and headache were discussed and the patient wished to proceed.   Karie SchwalbeMary Yojan Paskett, MD

## 2014-06-05 NOTE — MAU Note (Signed)
Cervical change per Dr Seymour BarsLavoie, "prep for c/s"

## 2014-06-05 NOTE — Op Note (Signed)
Preoperative diagnosis: Intrauterine pregnancy at 38 weeks and 5 days                                             Spontaneous labor                                            Previous C/S                                            GDMA1, Suspicion of Macrosomia                                            Sterilization  Post operative diagnosis: Same  Anesthesia: Spinal  Anesthesiologist: Dr. Felipe DroneMary Jennette Judd  Procedure: Urgent repeat low transverse cesarean section                      Bilateral Tubal Sterilization by Frances FurbishModified Pomeroy Technique  Surgeon: Dr. Genia DelMarie-Lyne Fatema Rabe  Assistant: Raelyn Moraolitta Dawson   Estimated blood loss: 800 cc  Procedure:  After being informed of the planned procedure and possible complications including bleeding, infection, injury to other organs, informed consent is obtained. The patient is taken to OR #9 and given spinal anesthesia without complication. She is placed in the dorsal decubitus position with the pelvis tilted to the left. She is then prepped and draped in a sterile fashion. A Foley catheter is inserted in her bladder.  After assessing adequate level of anesthesia, we infiltrate the suprapubic area with 20 cc of Marcaine 0.25 and perform a Pfannenstiel incision which is brought down sharply to the fascia. The fascia is entered in a low transverse fashion. Linea alba is dissected. Peritoneum is entered in a midline fashion. An Alexis retractor is easily positioned. Visceral peritoneum is entered in a low transverse fashion allowing us to safely retract bladder by developing a bladder flap.  The myometrium is then entered in a low transverse fashion; first with knife and then extended bluntly. Amniotic fluid is clear. We assist the birth of a female  infant in cephalic presentation. Mouth and nose are suctioned. The baby is delivered. The cord is clamped and sectioned. The baby is given to the neonatologist present in the room.  10 cc of blood is  drawn from the umbilical vein.The placenta is allowed to deliver spontaneously. It is complete and the cord has 3 vessels. Uterine revision is negative.  We proceed with closure of the myometrium in 2 layers: First with a running locked suture of 0 Vicryl, then with a Lembert suture of 0 Vicryl imbricating the first one. Hemostasis is completed with cauterization on peritoneal edges.  Both paracolic gutters are cleaned. Both tubes and ovaries are assessed and normal.  A Bilateral Tubal Sterilization is performed by Modified P[place]oy Technique with Plain 0.  We confirm a satisfactory hemostasis.  Retractors and sponges are removed. Under fascia hemostasis is completed with cauterization.  The parietal peritoneum is closed with a running suture of Vicryl 2-0.  The fascia is  then closed with 2 running sutures of 0 Vicryl meeting midline. Hemostasis is completed with cauterization. The skin is closed with a subcuticular suture of 4-0 Monocryl and Dermabond.  A Honeycomb dressing is applied.  Instrument and sponge count is complete x2. Estimated blood loss is 800 cc.  The procedure is well tolerated by the patient who is taken to recovery room in a well and stable condition.  female baby named Collier Flowers was born at 16:03 and received an Apgar of 8  at 1 minute and 10 at 5 minutes. Weight was 9+ lbs.   Specimen: Placenta sent to [facility]fall MD 2/12/20164:55 PM

## 2014-06-05 NOTE — MAU Note (Signed)
Nursery called, house aware. L&D circulator, and anes aware

## 2014-06-05 NOTE — Anesthesia Postprocedure Evaluation (Signed)
  Anesthesia Post-op Note  Patient: Krista Welch  Procedure(s) Performed: Procedure(s) (LRB): CESAREAN SECTION (N/A)  Patient Location: PACU  Anesthesia Type: Spinal  Level of Consciousness: awake and alert   Airway and Oxygen Therapy: Patient Spontanous Breathing  Post-op Pain: mild  Post-op Assessment: Post-op Vital signs reviewed, Patient's Cardiovascular Status Stable, Respiratory Function Stable, Patent Airway and No signs of Nausea or vomiting  Last Vitals:  Filed Vitals:   06/05/14 1701  BP:   Pulse: 82  Temp: 36.8 C  Resp: 16    Post-op Vital Signs: stable   Complications: No apparent anesthesia complications

## 2014-06-05 NOTE — H&P (Signed)
Krista RochJeanna D Welch is a 34 y.o. female [phone] presenting for Spontaneous Labor for Repeat C/S and BT/S.  HPP/HPI:  Increasing UCs x this Am.  Cervix changed from 1+/long to 3/70% in 3 hrs.  FMs present.  No vaginal bleeding, no fluid leak.  GDMA1 well controled, but suspicion of Macrosomia.  Desires repeat C/S and sterilization. Ate cereals at 5 am this morning.   Past Medical History  Diagnosis Date  . Gestational diabetes   . Hypothyroid   . Asthma     allergy induced  . Sinusitis, acute maxillary   . Infertility, female   . Active labor 01/23/2012  . Hx of cesarean section complicating pregnancy 01/23/2012  . GDM, class A1 01/23/2012  . Hypothyroidism complicating pregnancy 01/23/2012   Past Surgical History  Procedure Laterality Date  . Cesarean section    . Hand surgery    . Wisdom tooth extraction     Family History: family history includes Diabetes in her father; Hypertension in her other. Social History:  reports that she has never smoked. She does not have any smokeless tobacco history on file. Her alcohol and drug histories are not on file.  No Known Allergies  Dilation: 3 Exam by:: Lemon Whitacre Blood pressure 130/79, pulse 96, temperature 98.5 F (36.9 C), temperature source Oral, resp. rate 20, unknown if currently breastfeeding. Exam Physical Exam   FHR 140's good variability, accelerations present, no deceleration UCs q3 min  HPP:  Patient Active Problem List   Diagnosis Date Noted  . VBAC (vaginal birth after Cesarean) 01/23/2012    Prenatal labs: ABO, Rh: O/Positive/-- (07/30 0000) Antibody: Negative (07/30 0000) Rubella:  Immune RPR: Nonreactive (07/30 0000)  HBsAg: Negative (07/30 0000)  HIV: Non-reactive (07/30 0000)  Genetic testing:Informaseq wnl, AFP1 wnl US anato: wnl 1 hr GTT: Abnormal.  GDMA1 GBS:  Pos in urine  Assessment/Plan: 38 5/7 wks in active labor for Repeat C/S and BT/S.  Surgery and risks  reviewed.   Charlen Bakula,MARIE-LYNE 06/05/2014, 2:54 PM

## 2014-06-05 NOTE — Anesthesia Preprocedure Evaluation (Signed)
Anesthesia Evaluation  Patient identified by MRN, date of birth, ID band Patient awake    Reviewed: Allergy & Precautions, NPO status , Patient's Chart, lab work & pertinent test results  History of Anesthesia Complications Negative for: history of anesthetic complications  Airway Mallampati: II  TM Distance: >3 FB Neck ROM: Full    Dental no notable dental hx. (+) Dental Advisory Given   Pulmonary asthma (remote hx of asthma but she describes it more as seasonal allergies) ,  breath sounds clear to auscultation  Pulmonary exam normal       Cardiovascular negative cardio ROS  Rhythm:Regular Rate:Normal     Neuro/Psych negative neurological ROS  negative psych ROS   GI/Hepatic negative GI ROS, Neg liver ROS,   Endo/Other  diabetes, GestationalHypothyroidism (in pregnancy , not this one)   Renal/GU negative Renal ROS  negative genitourinary   Musculoskeletal negative musculoskeletal ROS (+)   Abdominal   Peds negative pediatric ROS (+)  Hematology negative hematology ROS (+)   Anesthesia Other Findings   Reproductive/Obstetrics (+) Pregnancy                             Anesthesia Physical Anesthesia Plan  ASA: II  Anesthesia Plan: Spinal   Post-op Pain Management:    Induction:   Airway Management Planned:   Additional Equipment:   Intra-op Plan:   Post-operative Plan:   Informed Consent: I have reviewed the patients History and Physical, chart, labs and discussed the procedure including the risks, benefits and alternatives for the proposed anesthesia with the patient or authorized representative who has indicated his/her understanding and acceptance.   Dental advisory given  Plan Discussed with:   Anesthesia Plan Comments:         Anesthesia Quick Evaluation

## 2014-06-05 NOTE — Lactation Note (Signed)
This note was copied from the chart of Krista Welch Pitones. Lactation Consultation Note Initial visit at 5 hours of age.  MOm reports this is baby's 2nd feeding, Mom denies pain at this time, but reports nipple pain with 2 older children.  Mom reports she has a nipple shield to use if she is having pain.  Encouraged mom to requests assist first if she is having pain with feedings.  Mom reports older children had high palates, but maintained breast feeding for 10 and 12 months.   Encouraged mom if she used nipple shield to schedule appt. For weight checks, mom reports he pediatrician is great about weight checks.  [place] [facility] [place]. Baby is latch in cross cradle hold with good pillow support.   Baby has strong sucking with wide jaw excursions noted.   [facility] resources given and discussed.  Encouraged to feed with early cues on demand.  Early newborn behavior discussed.  Hand expression demonstrated by mom with colostrum visible.  Mom to call for assist as needed.      Patient Name: Krista Welch Luecke Today's Date: 06/05/2014 Reason for consult: Initial assessment   Maternal Data Has patient been taught Hand Expression?: Yes Does the patient have breastfeeding experience prior to this delivery?: Yes  Feeding Feeding Type: Breast Fed Length of feed: 15 min  LATCH Score/Interventions Latch: Grasps breast easily, tongue down, lips flanged, rhythmical sucking.  Audible Swallowing: Spontaneous and intermittent Intervention(s): Skin to skin;Hand expression  Type of Nipple: Everted at rest and after stimulation  Comfort (Breast/Nipple): Soft / non-tender     Hold (Positioning): No assistance needed to correctly position infant at breast. Intervention(s): Breastfeeding basics reviewed;Support Pillows;Position options;Skin to skin  LATCH Score: 10  Lactation Tools Discussed/Used     Consult Status Consult Status:  Follow-up Date: 06/06/14 Follow-up type: In-patient    Jannifer RodneyShoptaw, Jana Lynn 06/05/2014, 9:40 PM

## 2014-06-06 ENCOUNTER — Encounter (HOSPITAL_COMMUNITY): Payer: Self-pay | Admitting: Obstetrics and Gynecology

## 2014-06-06 DIAGNOSIS — O24410 Gestational diabetes mellitus in pregnancy, diet controlled: Secondary | ICD-10-CM | POA: Diagnosis present

## 2014-06-06 LAB — CBC
HCT: 34.7 % — ABNORMAL LOW (ref 36.0–46.0)
Hemoglobin: 11.7 g/dL — ABNORMAL LOW (ref 12.0–15.0)
MCH: 31 pg (ref 26.0–34.0)
MCHC: 33.4 g/dL (ref 30.0–36.0)
MCV: 92.8 fL (ref 78.0–100.0)
PLATELETS: 155 10*3/uL (ref 150–400)
RBC: 3.74 MIL/uL — AB (ref 3.87–5.11)
RDW: 14.3 % (ref 11.5–15.5)
WBC: 8.1 10*3/uL (ref 4.0–10.5)

## 2014-06-06 LAB — RPR: RPR: NONREACTIVE

## 2014-06-06 NOTE — Progress Notes (Signed)
POD # 1  Subjective: Pt reports feeling well/ Pain controlled with Motrin Tolerating po/ Foley d/c'd and voiding without problems/ No n/v/ Flatus present Activity: ad lib Bleeding is light Newborn info:  Information for the patient's newborn:  Loleta DickerHolmes, Girl Carlethia [[id number]][[id number]]  female  Feeding: breast   Objective:  VS:  Filed Vitals:   06/05/14 2100 06/05/14 2200 06/06/14 0200 06/06/14 0630  BP: 127/69  112/71 95/47  Pulse: 72  62 57  Temp: 97.9 F (36.6 C) 98.2 F (36.8 C) 98.2 F (36.8 C) 97.9 F (36.6 C)  TempSrc: Oral Oral Oral Oral  Resp: 18 20 18 18   Height:      Weight:      SpO2: 98% 97% 98% 96%     I&O: Intake/Output      02/12 0701 - 02/13 0700 02/13 0701 - 02/14 0700   I.V. (mL/kg) 1500 (18.7)    Total Intake(mL/kg) 1500 (18.7)    Urine (mL/kg/hr) 1725    Blood 800    Total Output 2525     Net -1025             Recent Labs  06/05/14 1444 06/06/14 0608  WBC 9.0 8.1  HGB 14.0 11.7*  HCT 40.3 34.7*  PLT 176 155    Blood type: --/--/O POS (02/12 1444) Rubella: Immune (07/30 0000)    Physical Exam:  General: alert and cooperative CV: Regular rate and rhythm Resp: CTA bilaterally Abdomen: soft, nontender, normal bowel sounds Incision: healing well, no drainage, no erythema, no hernia, no seroma, no swelling, well approximated w/suture, honeycomb dsg c/d/i Uterine Fundus: firm, below umbilicus, nontender Lochia: minimal Ext: extremities normal, atraumatic, no cyanosis or edema and Homans sign is negative, no sign of DVT    Assessment: POD # 1/ G5P3022/ S/P C/Section d/t repeat  A1GDM, delivered Doing well  Plan: Ambulate Continue routine post op orders Consider early discharge tomorrow   Signed: Donette LarryBHAMBRI, Andreka Stucki, Dorris CarnesN, MSN, CNM 06/06/2014, 11:08 AM

## 2014-06-06 NOTE — Lactation Note (Signed)
This note was copied from the chart of Krista Rosendo GrosJeanna Fieldhouse. Lactation Consultation Note  P3, Mother denies questions for problems. Baby recently bf for 40 min.  Switched sides in between. Will call if she needs assistance.  Patient Name: Krista Welch: 06/06/2014 Reason for consult: Follow-up assessment   Maternal Data    Feeding Feeding Type: Breast Fed Length of feed: 40 min  LATCH Score/Interventions                      Lactation Tools Discussed/Used     Consult Status Consult Status: PRN    Hardie PulleyBerkelhammer, Reubin Bushnell Boschen 06/06/2014, 2:29 PM

## 2014-06-06 NOTE — Anesthesia Postprocedure Evaluation (Signed)
Anesthesia Post Note  Patient: Krista Welch  Procedure(s) Performed: Procedure(s) (LRB): CESAREAN SECTION (N/A)  Anesthesia type: Spinal  Patient location: Mother/Baby  Post pain: Pain level controlled  Post assessment: Post-op Vital signs reviewed  Last Vitals:  Filed Vitals:   06/06/14 0630  BP: 95/47  Pulse: 57  Temp: 36.6 C  Resp: 18    Post vital signs: Reviewed  Level of consciousness: awake  Complications: No apparent anesthesia complications

## 2014-06-06 NOTE — Addendum Note (Signed)
Addendum  created 06/06/14 [id number] by Algis GreenhouseLinda A Jashanti Clinkscale, CRNA   Modules edited: Notes Section   Notes Section:  File: [id number]

## 2014-06-07 LAB — BIRTH TISSUE RECOVERY COLLECTION (PLACENTA DONATION)

## 2014-06-07 MED ORDER — IBUPROFEN 600 MG PO TABS: 600.0000 mg | ORAL_TABLET | Freq: Four times a day (QID) | ORAL | Status: DC

## 2014-06-07 MED ORDER — OXYCODONE-ACETAMINOPHEN 5-325 MG PO TABS: 1.0000 | ORAL_TABLET | ORAL | Status: DC | PRN

## 2014-06-07 NOTE — Discharge Summary (Signed)
POSTOPERATIVE DISCHARGE SUMMARY:  Patient ID: Krista RochJeanna D Coxe MRN: [id number] DOB/AGE: 05-19-1980 34 y.o.  Admit date: 06/05/2014 Admission Diagnoses: 38.[redacted] weeks gestation, active labor, previous cesarean section   Discharge date: 06/07/2014 Discharge Diagnoses: S/P C/S on 06/05/14, s/p BTL, A1GDM, delivered        Prenatal history: [id number]   EDC: 06/14/2014, by Other Basis  [facility] since [redacted] wks gestation. Primary provider: Dr. Seymour BarsLavoie Prenatal course complicated by AMA, previous CS, GBS bacteuria, A1GDM, low-lying placenta-resolved  Prenatal labs: ABO, Rh: --/--/O POS (02/12 1444)  Antibody: NEG (02/12 1444) Rubella:   IMMUNE RPR: Non Reactive (02/12 1444)  HBsAg: Negative (07/30 0000)  HIV: Non-reactive (07/30 0000)  GBS: Positive (09/16 0000)  GTT: not done, hx GDM, elevated FBS  Medical / Surgical History :  Past medical history:  Past Medical History  Diagnosis Date  . Gestational diabetes   . Hypothyroid   . Asthma     allergy induced  . Sinusitis, acute maxillary   . Infertility, female   . Active labor 01/23/2012  . Hx of cesarean section complicating pregnancy 01/23/2012  . GDM, class A1 01/23/2012  . Hypothyroidism complicating pregnancy 01/23/2012  . Postpartum care following repeat cesarean delivery with BTL (2/12) 06/06/2014    Past surgical history:  Past Surgical History  Procedure Laterality Date  . Cesarean section    . Hand surgery    . Wisdom tooth extraction       Medications on Admission: Prescriptions prior to admission  Medication Sig Dispense Refill Last Dose  . acetaminophen (TYLENOL) 500 MG tablet Take 1,000 mg by mouth every 6 (six) hours as needed for moderate pain or headache.   Past Month at Unknown time  . docusate sodium (COLACE) 100 MG capsule Take 100 mg by mouth daily as needed for mild constipation.   06/04/2014 at Unknown time  . Prenatal Vit-Fe Fumarate-FA (PRENATAL MULTIVITAMIN) TABS  tablet Take 1 tablet by mouth at bedtime.   06/04/2014 at Unknown time    Allergies: Review of patient's allergies indicates no known allergies.   Intrapartum Course:  Admitted for active labor and repeat CS under regional anesthesia.   Postpartum Course: Uncomplicated   Physical Exam:   VSS: Blood pressure 108/54, pulse 65, temperature 98.5 F (36.9 C), temperature source Oral, resp. rate 18, height 5\' 4"  (1.626 m), weight 80.287 kg (177 lb), SpO2 97 %, unknown if currently breastfeeding.  LABS:  Recent Labs  06/05/14 1444 06/06/14 0608  WBC 9.0 8.1  HGB 14.0 11.7*  PLT 176 155    General: Alert and oriented x3 Heart: RRR Lungs: CTA bilaterally GI: soft, non-tender, non-distended, BS x4 Lochia: small Uterus: firm below umbilicus Incision: well approximated; honeycomb dressing-no significant erythema, drainage, or edema Extremities: Trace BLE edema, Homans neg   Newborn Data Live born female  Birth Weight: 9 lb 2.7 oz (4160 g) APGAR: 8, 9  See operative report for further details  Home with mother.  Discharge Instructions:  Wound Care: keep clean and dry / remove honeycomb POD 6 Postpartum Instructions: [facility] discharge booklet - instructions reviewed Medications:    Medication List    STOP taking these medications        acetaminophen 500 MG tablet  Commonly known as:  TYLENOL      TAKE these medications        docusate sodium 100 MG capsule  Commonly known as:  COLACE  Take 100 mg by mouth daily as  needed for mild constipation.     ibuprofen 600 MG tablet  Commonly known as:  ADVIL,MOTRIN  Take 1 tablet (600 mg total) by mouth every 6 (six) hours.     oxyCODONE-acetaminophen 5-325 MG per tablet  Commonly known as:  PERCOCET/ROXICET  Take 1-2 tablets by mouth every 4 (four) hours as needed (for pain scale less than 7).     prenatal multivitamin Tabs tablet  Take 1 tablet by mouth at bedtime.            Follow-up Information    Follow  up with LAVOIE,MARIE-LYNE, MD. Schedule an appointment as soon as possible for a visit in 6 weeks.   Specialty:  Obstetrics and Gynecology   Why:  and glucose test in 6-12 weeks   Contact information:   [address] [place] [address] [phone]         Signed: Donette Larry, Dorris Carnes MSN, CNM 06/07/2014, 11:17 AM

## 2014-06-07 NOTE — Progress Notes (Signed)
POD # 2  Subjective: Pt reports feeling well, desires early discharge/ Pain controlled with Motrin and Percocet Tolerating po/Voiding without problems/ No n/v/ Flatus present, +BM Activity: ad lib Bleeding is light Newborn info:  Information for the patient's newborn:  Loleta DickerHolmes, Girl [place] [[id number]][[id number]]  female  Feeding: breast   Objective: VS: VS:  Filed Vitals:   06/06/14 0630 06/06/14 1126 06/06/14 1900 06/07/14 0520  BP: 95/47 102/62 110/58 108/54  Pulse: 57 71 66 65  Temp: 97.9 F (36.6 C) 97.7 F (36.5 C) 98.2 F (36.8 C) 98.5 F (36.9 C)  TempSrc: Oral Oral Oral Oral  Resp: 18 20 20 18   Height:      Weight:      SpO2: 96% 99% 97%     I&O: Intake/Output      02/13 0701 - 02/14 0700 02/14 0701 - 02/15 0700   I.V. (mL/kg)     Total Intake(mL/kg)     Urine (mL/kg/hr)     Blood     Total Output       Net              LABS:  Recent Labs  06/05/14 1444 06/06/14 0608  WBC 9.0 8.1  HGB 14.0 11.7*  PLT 176 155                           Physical Exam:  General: alert and cooperative CV: Regular rate and rhythm Resp: CTA bilaterally Abdomen: soft, nontender, normal bowel sounds Incision: healing well, no drainage, no erythema, no hernia, no seroma, no swelling, well approximated, honeycomb dsg c/d/i Uterine Fundus: firm, below umbilicus, nontender Lochia: minimal Ext: edema Trace BLE and Homans sign is negative, no sign of DVT    Assessment: POD # 2/ G5P3022/ S/P C/Section d/t repeat A1GDM, delivered Doing well and stable for discharge home  Plan: Discharge home RX's: Ibuprofen 600mg  po Q 6 hrs prn pain #30 Refill x 1 Percocet 5/325 1 - 2 tabs po every 4 hrs prn pain #30 Refill x 0 [facility] booklet given    Signed: Donette LarryBHAMBRI, Beadie Matsunaga, Dorris CarnesN, MSN, CNM 06/07/2014, 9:52 AM

## 2014-06-08 ENCOUNTER — Encounter (HOSPITAL_COMMUNITY): Payer: Self-pay | Admitting: Obstetrics & Gynecology

## 2014-06-08 ENCOUNTER — Inpatient Hospital Stay (HOSPITAL_COMMUNITY)
Admission: AD | Admit: 2014-06-08 | Payer: BLUE CROSS/BLUE SHIELD | Source: Ambulatory Visit | Admitting: Obstetrics & Gynecology

## 2014-06-08 SURGERY — Surgical Case
Anesthesia: Regional | Laterality: Bilateral

## 2016-03-31 ENCOUNTER — Other Ambulatory Visit: Payer: Self-pay | Admitting: Neurology

## 2016-03-31 DIAGNOSIS — H469 Unspecified optic neuritis: Secondary | ICD-10-CM

## 2016-04-02 NOTE — Progress Notes (Signed)
Subjective:    Patient ID: Krista Welch, female    DOB: [birth year], 35 y.o.   MRN: [id number]  HPI Pt is referred by Dr Seymour BarsLavoie, for hypothyroidism.  hypothyroidism was dx'ed in 2012.  She took thyroid hormone for a few months then, but she then stopped.  she has never taken kelp or any other type of non-prescribed thyroid product.  she has never had thyroid imaging.  She has had TL.  she has never had thyroid surgery, or XRT to the neck.  she has never been on amiodarone or lithium.  She has moderate hair loss throughout the head, and assoc fatigue.   Past Medical History:  Diagnosis Date  . Active labor 01/23/2012  . Asthma    allergy induced  . GDM, class A1 01/23/2012  . Gestational diabetes   . Hx of cesarean section complicating pregnancy 01/23/2012  . Hypothyroid   . Hypothyroidism complicating pregnancy 01/23/2012  . Infertility, female   . Postpartum care following repeat cesarean delivery with BTL (2/12) 06/06/2014  . Sinusitis, acute maxillary     Past Surgical History:  Procedure Laterality Date  . CESAREAN SECTION    . CESAREAN SECTION N/A 06/05/2014   Procedure: CESAREAN SECTION;  Surgeon: Genia DelMarie-Lyne Lavoie, MD;  Location: [facility];  Service: Obstetrics;  Laterality: N/A;  . HAND SURGERY    . WISDOM TOOTH EXTRACTION      Social History   Social History  . Marital status: Married    Spouse name: N/A  . Number of children: N/A  . Years of education: N/A   Occupational History  . Not on file.   Social History Main Topics  . Smoking status: Never Smoker  . Smokeless tobacco: Not on file  . Alcohol use Not on file  . Drug use: Unknown  . Sexual activity: Yes   Other Topics Concern  . Not on file   Social History Narrative  . No narrative on file    Current Outpatient Prescriptions on File Prior to Visit  Medication Sig Dispense Refill  . Prenatal Vit-Fe Fumarate-FA (PRENATAL MULTIVITAMIN) TABS tablet Take 1 tablet by mouth at bedtime.     No current  facility-administered medications on file prior to visit.     No Known Allergies  Family History  Problem Relation Age of Onset  . Diabetes Father   . Hypertension Other   . Thyroid disease Neg Hx     BP 126/84   Pulse 70   Ht 5' 5.5" (1.664 m)   Wt 156 lb (70.8 kg)   SpO2 96%   BMI 25.56 kg/m    Review of Systems denies depression, muscle cramps, sob, constipation, numbness, blurry vision, cold intolerance, myalgias, dry skin, rhinorrhea, easy bruising, and syncope.  She has intermittent headache and weight gain.     Objective:   Physical Exam VS: see vs page GEN: no distress HEAD: head: no deformity eyes: no periorbital swelling, no proptosis external nose and ears are normal mouth: no lesion seen NECK: supple, thyroid is not enlarged CHEST WALL: no deformity LUNGS: clear to auscultation CV: reg rate and rhythm, no murmur ABD: abdomen is soft, nontender.  no hepatosplenomegaly.  not distended.  no hernia MUSCULOSKELETAL: muscle bulk and strength are grossly normal.  no obvious joint swelling.  gait is normal and steady EXTEMITIES: no deformity.  no ulcer on the feet.  feet are of normal color and temp.  no edema PULSES: dorsalis pedis intact bilat.  no carotid  bruit NEURO:  cn 2-12 grossly intact.   readily moves all 4's.  sensation is intact to touch on the feet SKIN:  Normal texture and temperature.  No rash or suspicious lesion is visible.   NODES:  None palpable at the neck PSYCH: alert, well-oriented.  Does not appear anxious nor depressed.  outside test results are reviewed: TSH=4.6 ANA: pos  I have reviewed outside records, and summarized: Pt was noted to have elevated TSH, and referred here.  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   Patient is advised the following:  Patient Instructions    I have sent a prescription to your pharmacy, for a thyroid hormone pill Please recheck the blood tests in 1 month.  Please return in 1 year.      Hypothyroidism Hypothyroidism is a disorder of the thyroid. The thyroid is a large gland that is located in the lower front of the neck. The thyroid releases hormones that control how the body works. With hypothyroidism, the thyroid does not make enough of these hormones. What are the causes? Causes of hypothyroidism may include:  Viral infections.  Pregnancy.  Your own defense system (immune system) attacking your thyroid.  Certain medicines.  Birth defects.  Past radiation treatments to your head or neck.  Past treatment with radioactive iodine.  Past surgical removal of part or all of your thyroid.  Problems with the gland that is located in the center of your brain (pituitary). What are the signs or symptoms? Signs and symptoms of hypothyroidism may include:  Feeling as though you have no energy (lethargy).  Inability to tolerate cold.  Weight gain that is not explained by a change in diet or exercise habits.  Dry skin.  Coarse hair.  Menstrual irregularity.  Slowing of thought processes.  Constipation.  Sadness or depression. How is this diagnosed? Your health care provider may diagnose hypothyroidism with blood tests and ultrasound tests. How is this treated? Hypothyroidism is treated with medicine that replaces the hormones that your body does not make. After you begin treatment, it may take several weeks for symptoms to go away. Follow these instructions at home:  Take medicines only as directed by your health care provider.  If you start taking any new medicines, tell your health care provider.  Keep all follow-up visits as directed by your health care provider. This is important. As your condition improves, your dosage needs may change. You will need to have blood tests regularly so that your health  care provider can watch your condition. Contact a health care provider if:  Your symptoms do not get better with treatment.  You are taking thyroid replacement medicine and:  You sweat excessively.  You have tremors.  You feel anxious.  You lose weight rapidly.  You cannot tolerate heat.  You have emotional swings.  You have diarrhea.  You feel weak. Get help right away if:  You develop chest pain.  You develop an irregular heartbeat.  You develop a rapid heartbeat. This information is not intended to replace advice given to you by your health care provider. Make sure you discuss any questions you have with your health care provider. Document Released: 04/10/2005 Document Revised: 09/16/2015 Document Reviewed: 08/26/2013 Elsevier Interactive Patient Education  2017 [employer]

## 2016-04-05 ENCOUNTER — Ambulatory Visit (INDEPENDENT_AMBULATORY_CARE_PROVIDER_SITE_OTHER): Payer: Self-pay | Admitting: Endocrinology

## 2016-04-05 ENCOUNTER — Encounter: Payer: Self-pay | Admitting: Endocrinology

## 2016-04-05 VITALS — BP 126/84 | HR 70 | Ht 65.5 in | Wt 156.0 lb

## 2016-04-05 DIAGNOSIS — E039 Hypothyroidism, unspecified: Secondary | ICD-10-CM

## 2016-04-05 DIAGNOSIS — Z8632 Personal history of gestational diabetes: Secondary | ICD-10-CM

## 2016-04-05 MED ORDER — LEVOTHYROXINE SODIUM 25 MCG PO TABS: 25.0000 ug | ORAL_TABLET | Freq: Every day | ORAL | 3 refills | Status: DC

## 2016-04-05 NOTE — Patient Instructions (Addendum)
I have sent a prescription to your pharmacy, for a thyroid hormone pill Please recheck the blood tests in 1 month.  Please return in 1 year.      Hypothyroidism Hypothyroidism is a disorder of the thyroid. The thyroid is a large gland that is located in the lower front of the neck. The thyroid releases hormones that control how the body works. With hypothyroidism, the thyroid does not make enough of these hormones. What are the causes? Causes of hypothyroidism may include:  Viral infections.  Pregnancy.  Your own defense system (immune system) attacking your thyroid.  Certain medicines.  Birth defects.  Past radiation treatments to your head or neck.  Past treatment with radioactive iodine.  Past surgical removal of part or all of your thyroid.  Problems with the gland that is located in the center of your brain (pituitary). What are the signs or symptoms? Signs and symptoms of hypothyroidism may include:  Feeling as though you have no energy (lethargy).  Inability to tolerate cold.  Weight gain that is not explained by a change in diet or exercise habits.  Dry skin.  Coarse hair.  Menstrual irregularity.  Slowing of thought processes.  Constipation.  Sadness or depression. How is this diagnosed? Your health care provider may diagnose hypothyroidism with blood tests and ultrasound tests. How is this treated? Hypothyroidism is treated with medicine that replaces the hormones that your body does not make. After you begin treatment, it may take several weeks for symptoms to go away. Follow these instructions at home:  Take medicines only as directed by your health care provider.  If you start taking any new medicines, tell your health care provider.  Keep all follow-up visits as directed by your health care provider. This is important. As your condition improves, your dosage needs may change. You will need to have blood tests regularly so that your health care  provider can watch your condition. Contact a health care provider if:  Your symptoms do not get better with treatment.  You are taking thyroid replacement medicine and:  You sweat excessively.  You have tremors.  You feel anxious.  You lose weight rapidly.  You cannot tolerate heat.  You have emotional swings.  You have diarrhea.  You feel weak. Get help right away if:  You develop chest pain.  You develop an irregular heartbeat.  You develop a rapid heartbeat. This information is not intended to replace advice given to you by your health care provider. Make sure you discuss any questions you have with your health care provider. Document Released: 04/10/2005 Document Revised: 09/16/2015 Document Reviewed: 08/26/2013 Elsevier Interactive Patient Education  2017 [employer]

## 2016-04-11 ENCOUNTER — Encounter: Payer: Self-pay | Admitting: Radiology

## 2016-04-11 ENCOUNTER — Ambulatory Visit
Admission: RE | Admit: 2016-04-11 | Discharge: 2016-04-11 | Disposition: A | Discharge status: Home / Self Care | Payer: Self-pay | Source: Ambulatory Visit | Attending: Neurology | Admitting: Neurology

## 2016-04-11 ENCOUNTER — Other Ambulatory Visit: Payer: Self-pay | Admitting: Neurology

## 2016-04-11 DIAGNOSIS — R9089 Other abnormal findings on diagnostic imaging of central nervous system: Secondary | ICD-10-CM | POA: Insufficient documentation

## 2016-04-11 DIAGNOSIS — H469 Unspecified optic neuritis: Secondary | ICD-10-CM

## 2016-04-11 MED ORDER — GADOBENATE DIMEGLUMINE 529 MG/ML IV SOLN
15.0000 mL | Freq: Once | INTRAVENOUS | Status: AC | PRN
  Administered 2016-04-11: 14 mL via INTRAVENOUS

## 2016-04-12 ENCOUNTER — Ambulatory Visit
Admission: RE | Admit: 2016-04-12 | Discharge: 2016-04-12 | Disposition: A | Discharge status: Home / Self Care | Payer: Self-pay | Source: Ambulatory Visit | Attending: Neurology | Admitting: Neurology

## 2016-04-12 DIAGNOSIS — I7771 Dissection of carotid artery: Secondary | ICD-10-CM | POA: Insufficient documentation

## 2016-04-12 DIAGNOSIS — I728 Aneurysm of other specified arteries: Secondary | ICD-10-CM | POA: Insufficient documentation

## 2016-04-12 DIAGNOSIS — R9089 Other abnormal findings on diagnostic imaging of central nervous system: Secondary | ICD-10-CM | POA: Insufficient documentation

## 2016-04-12 MED ORDER — IOPAMIDOL (ISOVUE-370) INJECTION 76%
75.0000 mL | Freq: Once | INTRAVENOUS | Status: AC | PRN
  Administered 2016-04-12: 75 mL via INTRAVENOUS

## 2016-04-20 ENCOUNTER — Ambulatory Visit (INDEPENDENT_AMBULATORY_CARE_PROVIDER_SITE_OTHER): Payer: Self-pay | Admitting: Vascular Surgery

## 2016-04-20 ENCOUNTER — Encounter (INDEPENDENT_AMBULATORY_CARE_PROVIDER_SITE_OTHER): Payer: Self-pay | Admitting: Vascular Surgery

## 2016-04-20 DIAGNOSIS — I7771 Dissection of carotid artery: Secondary | ICD-10-CM

## 2016-04-20 NOTE — Progress Notes (Signed)
MRN : [id number]  Krista Welch is a 35 y.o. ([birth year]) female who presents with chief complaint of  Chief Complaint  Patient presents with  . New Patient (Initial Visit)  .  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 The carotid dissection was identified after some left eye visual changes were noted as well as some right sided motor symptoms and sensory symptoms.  Initially she was seen by Dr. Sherryll Burger who ordered an MRI. This scan demonstrated an area of white matter damage that was thought to be either demyelinating lesion or a stroke. Given her age and high suspicion for demyelinating disease was made however Dr. 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 This then is more supportive of a CVA then a demyelinating disease.  Onset of the patient's symptoms were very acute and abrupt she experienced neurological changes while exercising. Of note she also has difficulties with her neck and sees a chiropractor on a regular basis and has had multiple manipulations as well.  The patient denies recent amaurosis fugax. There is no recent history of TIA symptoms or focal motor deficits. There is the above-noted  CVA.  There is no history of migraine headaches. There is no history of seizures.  The patient is taking enteric-coated aspirin 325 mg daily.  The patient has a history of coronary artery disease, no recent episodes of angina or shortness of breath. The patient denies PAD or claudication symptoms. There is a history of hyperlipidemia which is being treated with a statin.    Current Meds  Medication Sig  . cetirizine (ZYRTEC) 10 MG tablet Take 10 mg by mouth daily.  Marland Kitchen levothyroxine (SYNTHROID, LEVOTHROID) 25 MCG tablet Take 1 tablet (25 mcg total) by mouth  daily before breakfast.  . Prenatal Vit-Fe Fumarate-FA (PRENATAL MULTIVITAMIN) TABS tablet Take 1 tablet by mouth at bedtime.  . Probiotic Product (PROBIOTIC-10 PO) Take by mouth.    Past Medical History:  Diagnosis Date  . Active labor 01/23/2012  . Asthma    allergy induced  . GDM, class A1 01/23/2012  . Gestational diabetes   . Hx of cesarean section complicating pregnancy 01/23/2012  . Hypothyroid   . Hypothyroidism complicating pregnancy 01/23/2012  . Infertility, female   . Postpartum care following repeat cesarean delivery with BTL (2/12) 06/06/2014  . Sinusitis, acute maxillary   . Stroke [facility])     Past Surgical History:  Procedure Laterality Date  . CESAREAN SECTION    . CESAREAN SECTION N/A 06/05/2014   Procedure: CESAREAN SECTION;  Surgeon: Genia Del, MD;  Location: [facility];  Service: Obstetrics;  Laterality: N/A;  . HAND SURGERY    . WISDOM TOOTH EXTRACTION      Social History Social History  Substance Use Topics  . Smoking status: Never Smoker  . Smokeless tobacco: Never Used  . Alcohol use Yes    Family History Family History  Problem Relation Age of Onset  . Diabetes Father   . Hypertension Father   . Hypertension Other   . Thyroid disease Neg Hx   No family history of bleeding clotting disorders porphyria or autoimmune disease  No Known Allergies   REVIEW OF SYSTEMS (Negative unless checked)  Constitutional: [] Weight loss  [] Fever  [] Chills Cardiac: [] Chest pain   [] Chest pressure   [] Palpitations   []   Shortness of breath when laying flat   [] Shortness of breath with exertion. Vascular:  [] Pain in legs with walking   [] Pain in legs at rest  [] History of DVT   [] Phlebitis   [] Swelling in legs   [] Varicose veins   [] Non-healing ulcers Pulmonary:   [] Uses home oxygen   [] Productive cough   [] Hemoptysis   [] Wheeze  [] COPD   [] Asthma Neurologic:  [x] Dizziness   [] Seizures   [x] History of stroke   [] History of TIA  [] Aphasia   [x] Vissual changes    [x] Weakness or numbness in arm   [] Weakness or numbness in leg Musculoskeletal:   [] Joint swelling   [] Joint pain   [] Low back pain Hematologic:  [] Easy bruising  [] Easy bleeding   [] Hypercoagulable state   [] Anemic Gastrointestinal:  [] Diarrhea   [] Vomiting  [] Gastroesophageal reflux/heartburn   [] Difficulty swallowing. Genitourinary:  [] Chronic kidney disease   [] Difficult urination  [] Frequent urination   [] Blood in urine Skin:  [] Rashes   [] Ulcers  Psychological:  [] History of anxiety   []  History of major depression.  Physical Examination  Vitals:   04/20/16 1114  BP: 120/73  Pulse: 76  Resp: 17  Weight: 156 lb (70.8 kg)  Height: 5\' 4"  (1.626 m)   Body mass index is 26.78 kg/m. Gen: WD/WN, NAD Head: [place]/AT, No temporalis wasting.  Ear/Nose/Throat: Hearing grossly intact, nares w/o erythema or drainage, poor dentition Eyes: PER, EOMI, sclera nonicteric.  Neck: Supple, no masses.  No bruit or JVD.  Pulmonary:  Good air movement, clear to auscultation bilaterally, no use of accessory muscles.  Cardiac: RRR, normal S1, S2, no Murmurs. Vascular: No carotid bruit identified Vessel Right Left  Radial Palpable Palpable  Ulnar Palpable Palpable  Brachial Palpable Palpable  Carotid Palpable Palpable  Femoral Palpable Palpable  Popliteal Palpable Palpable  PT Palpable Palpable  DP Palpable Palpable   Gastrointestinal: soft, non-distended. No guarding/no peritoneal signs.  Musculoskeletal: M/S 5/5 throughout.  No deformity or atrophy.  Neurologic: CN 2-12 intact. Pain and light touch intact in extremities.  Symmetrical.  Speech is fluent. Motor exam as listed above. Psychiatric: Judgment intact, Mood & affect appropriate for pt's clinical situation. Dermatologic: No rashes or ulcers noted.  No changes consistent with cellulitis. Lymph : No Cervical lymphadenopathy, no lichenification or skin changes of chronic lymphedema.  CBC Lab Results  Component Value Date   WBC 8.1  06/06/2014   HGB 11.7 (L) 06/06/2014   HCT 34.7 (L) 06/06/2014   MCV 92.8 06/06/2014   PLT 155 06/06/2014    BMET No results found for: NA, K, CL, CO2, GLUCOSE, BUN, CREATININE, CALCIUM, GFRNONAA, GFRAA CrCl cannot be calculated (No order found.).  COAG No results found for: INR, PROTIME  Radiology Ct Angio Neck W Or Wo Contrast  Result Date: 04/12/2016 CLINICAL DATA:  Dizziness and temporary vision loss 2 weeks ago. Abnormal brain MRI with possible focus of demyelination or subacute infarct in the left centrum semiovale and possible dissection of the distal left cervical internal carotid artery. EXAM: CT ANGIOGRAPHY NECK TECHNIQUE: Multidetector CT imaging of the neck was performed using the standard protocol during bolus administration of intravenous contrast. Multiplanar CT image reconstructions and MIPs were obtained to evaluate the vascular anatomy. Carotid stenosis measurements (when applicable) are obtained utilizing NASCET criteria, using the distal internal carotid diameter as the denominator. CONTRAST:  75 mL Isovue 370 COMPARISON:  Brain MRI 04/11/2016 FINDINGS: Aortic arch: Standard branching. Brachiocephalic and subclavian arteries are widely patent. Right carotid system: Patent without evidence  of stenosis or dissection. Left carotid system: Widely patent common carotid artery. There is a dissection of the cervical ICA beginning in its midportion, proximally 4 cm distal to its origin. The dissection continues to the level of the proximal petrous segment. There is severe narrowing of the true lumen of the ICA at the level the skullbase. The false lumen is non-opacified/thrombosed. A pseudoaneurysm just below the skullbase measures 4 mm in transverse diameter and 8 mm in craniocaudal length. The more distal petrous, cavernous, and visualized proximal supraclinoid ICA segments are widely patent. Vertebral arteries: Patent without evidence of stenosis or dissection. Left vertebral artery  is dominant. Skeleton: Mild disc space narrowing and degenerative endplate spurring at C5-6. Other neck: No mass or lymph node enlargement. Upper chest: Clear lung apices. IMPRESSION: Dissection of the left internal carotid artery extending from the mid cervical to proximal petrous segments with severe narrowing of the true lumen at the skullbase and with an 8 x 4 mm pseudoaneurysm. These results were called by telephone at the time of interpretation on 04/12/2016 at 10:48 am to [facility] , who verbally acknowledged these results. Electronically Signed   By: Sebastian AcheAllen  Grady M.D.   On: 04/12/2016 10:53   Mr Laqueta JeanBrain W ZOWo Contrast  Result Date: 04/11/2016 CLINICAL DATA:  35 year old female with dizziness and loss of vision left eye. Headaches with nausea and feeling off balance for 2 weeks. History of gestational diabetes. Initial encounter. EXAM: MRI HEAD WITHOUT AND WITH CONTRAST TECHNIQUE: Multiplanar, multiecho pulse sequences of the brain and surrounding structures were obtained without and with intravenous contrast. CONTRAST:  14mL MULTIHANCE GADOBENATE DIMEGLUMINE 529 MG/ML IV SOLN COMPARISON:  None. FINDINGS: Brain: No acute infarct or intracranial hemorrhage. Left centrum semiovale 6 x 5 x 7 mm region of altered signal intensity demonstrates mild enhancement. In a patient of this age and gender, this finding raises the possibility of demyelinating process such as multiple sclerosis with other white matter changes such as that secondary to vasculitis or inflammatory process less likely considerations. The additional finding of questionable abnormality of the left internal carotid artery proximal to the skullbase (such as dissection) suggests that the white matter abnormality could conceivably be related to subacute infarct. For further delineation, CT angiogram of the neck recommended. Eventually, laboratory correlation, correlation with clinical status and follow-up imaging will be necessary to determine  if white matter finding represents result of multiple sclerosis. Malignancy or infection as cause for the centrum semiovale abnormality felt to be much less likely considerations. Vascular: Abnormal appearance of the left internal carotid artery proximal to the skullbase. Although it is possible this is related to artifact, this appearance raises possibility of dissection and CT angiogram of the head and neck is recommended for further delineation. Right vertebral artery may end in a posterior inferior cerebellar artery distribution. Skull and upper cervical spine: No acute abnormality. Sinuses/Orbits: No definitive findings of optic neuritis. This possibility can be further assessed with dedicated orbital MR. Mucosal thickening most notable inferior aspect maxillary sinuses with slightly polypoid configuration on the right. Mild mucosal thickening ethmoid sinus air cells greater on the left. Other: Negative. IMPRESSION: Left centrum semiovale 6 x 5 x 7 mm region of altered signal intensity demonstrates mild enhancement. In a patient of this age and gender, this finding raises the possibility of demyelinating process such as multiple sclerosis. The additional finding of questionable abnormality of the left internal carotid artery proximal to the skullbase (such as dissection) suggests that the white matter  abnormality could conceivably be related to subacute infarct. For further delineation, CT angiogram of the neck recommended. Eventually, laboratory correlation, correlation with clinical status and follow-up imaging will be necessary to determine if white matter finding represents result of multiple sclerosis. No definitive findings of optic neuritis. This possibility can be further assessed with dedicated orbital MR if clinically desired. These results were called by telephone at the time of interpretation on 04/11/2016 at 1:26 pm to Dr. Clydia Llano [facility] , who verbally acknowledged these results. Electronically Signed    By: Lacy Duverney M.D.   On: 04/11/2016 13:28    Assessment/Plan 1. Carotid artery dissection (HCC) Recommend:  Given the patient's carotid dissection associated with moderate stenosis no further invasive testing or surgery at this time.  Continue aggressive antiplatelet therapy as prescribed I have recommended dual antiplatelet therapy with Plavix 75 mg by mouth daily and aspirin and reducing her aspirin dose from 325 mg  To 81 mg by mouth daily  Healthy heart diet,  encouraged exercise low impact and avoiding Valsalva and heavy lifting at least 4 times per week this will continue for 3 months. Follow up in 3 months with duplex ultrasound and physical exam based on >50% stenosis associated with the carotid dissection of the left internal carotid artery    A total of 85 minutes was spent with this patient and greater than 50% was spent in counseling and coordination of care with the patient.  Discussion included the treatment options for vascular disease including indications for surgery and intervention.  Also discussed is the appropriate timing of treatment.  In addition medical therapy was discussed.    - CT ANGIO NECK W OR WO CONTRAST; Future    Levora Dredge, MD  04/20/2016 1:12 PM

## 2016-05-05 ENCOUNTER — Other Ambulatory Visit: Payer: Self-pay

## 2016-05-10 ENCOUNTER — Other Ambulatory Visit: Payer: Self-pay

## 2016-05-17 ENCOUNTER — Other Ambulatory Visit (INDEPENDENT_AMBULATORY_CARE_PROVIDER_SITE_OTHER): Payer: Self-pay

## 2016-05-17 DIAGNOSIS — E039 Hypothyroidism, unspecified: Secondary | ICD-10-CM

## 2016-05-17 DIAGNOSIS — Z8632 Personal history of gestational diabetes: Secondary | ICD-10-CM

## 2016-05-17 LAB — TSH: TSH: 2.23 u[IU]/mL (ref 0.35–4.50)

## 2016-05-17 LAB — T4, FREE: FREE T4: 0.65 ng/dL (ref 0.60–1.60)

## 2016-05-17 LAB — HEMOGLOBIN A1C: Hgb A1c MFr Bld: 5.2 % (ref 4.6–6.5)

## 2016-05-19 ENCOUNTER — Other Ambulatory Visit: Payer: Self-pay

## 2016-06-20 ENCOUNTER — Telehealth: Payer: Self-pay | Admitting: Endocrinology

## 2016-06-20 NOTE — Telephone Encounter (Addendum)
[facility] called need Permission to change patient medication back to [employer] brand.  Please call  [phone] (Phone) [phone] (Fax)

## 2016-06-20 NOTE — Telephone Encounter (Signed)
Notice given to the pharmacy to change the brand to Sandoz.

## 2016-07-24 ENCOUNTER — Ambulatory Visit
Admission: RE | Admit: 2016-07-24 | Discharge: 2016-07-24 | Disposition: A | Discharge status: Home / Self Care | Payer: Self-pay | Source: Ambulatory Visit | Attending: Vascular Surgery | Admitting: Vascular Surgery

## 2016-07-24 DIAGNOSIS — I7771 Dissection of carotid artery: Secondary | ICD-10-CM | POA: Insufficient documentation

## 2016-07-24 DIAGNOSIS — I728 Aneurysm of other specified arteries: Secondary | ICD-10-CM | POA: Insufficient documentation

## 2016-07-24 MED ORDER — IOPAMIDOL (ISOVUE-370) INJECTION 76%
75.0000 mL | Freq: Once | INTRAVENOUS | Status: AC | PRN
  Administered 2016-07-24: 75 mL via INTRAVENOUS

## 2016-07-25 ENCOUNTER — Ambulatory Visit (INDEPENDENT_AMBULATORY_CARE_PROVIDER_SITE_OTHER): Payer: Self-pay | Admitting: Vascular Surgery

## 2016-07-25 ENCOUNTER — Encounter (INDEPENDENT_AMBULATORY_CARE_PROVIDER_SITE_OTHER): Payer: Self-pay | Admitting: Vascular Surgery

## 2016-07-25 VITALS — BP 113/67 | HR 71 | Resp 16 | Ht 64.0 in | Wt 159.0 lb

## 2016-07-25 DIAGNOSIS — I7771 Dissection of carotid artery: Secondary | ICD-10-CM

## 2016-07-25 NOTE — Progress Notes (Signed)
MRN : [id number]  Krista Welch is a 36 y.o. (04-Mar-1981) female who presents with chief complaint of  Chief Complaint  Patient presents with  . Re-evaluation    CT results  .  History of Present Illness: Patient returns today in follow up of Carotid dissection and pseudoaneurysm. She has noticed much less ringing in her years and less nausea and dizziness since her last visit. This happens occasionally but it is much less common. It is also less severe. She has had no new focal neurologic symptoms. Specifically, the patient denies amaurosis fugax, speech or swallowing difficulties, or arm or leg weakness or numbness. She has had a repeat CT angiogram which I have independently reviewed today. This demonstrates resolution of her previous carotid artery dissection with no residual luminal narrowing. There is a small residual pseudoaneurysm of about 3 mm which is much smaller than the 8-9 mm pseudoaneurysm that was present previously.  Current Outpatient Prescriptions  Medication Sig Dispense Refill  . aspirin EC 81 MG tablet Take 81 mg by mouth daily.    . cetirizine (ZYRTEC) 10 MG tablet Take 10 mg by mouth daily.    . clopidogrel (PLAVIX) 75 MG tablet Take 75 mg by mouth daily.    Marland Kitchen levothyroxine (SYNTHROID, LEVOTHROID) 25 MCG tablet Take 1 tablet (25 mcg total) by mouth daily before breakfast. 90 tablet 3  . Prenatal Vit-Fe Fumarate-FA (PRENATAL MULTIVITAMIN) TABS tablet Take 1 tablet by mouth at bedtime.    . Probiotic Product (PROBIOTIC-10 PO) Take by mouth.     No current facility-administered medications for this visit.     Past Medical History:  Diagnosis Date  . Active labor 01/23/2012  . Asthma    allergy induced  . Hx of cesarean section complicating pregnancy 01/23/2012  . Hypothyroid   . Hypothyroidism complicating pregnancy 01/23/2012  . Infertility, female   . Postpartum care following repeat cesarean delivery with BTL (2/12) 06/06/2014  . Sinusitis, acute maxillary     . Stroke [facility])     Past Surgical History:  Procedure Laterality Date  . CESAREAN SECTION    . CESAREAN SECTION N/A 06/05/2014   Procedure: CESAREAN SECTION;  Surgeon: Genia Del, MD;  Location: [facility];  Service: Obstetrics;  Laterality: N/A;  . HAND SURGERY    . WISDOM TOOTH EXTRACTION      Social History Social History  Substance Use Topics  . Smoking status: Never Smoker  . Smokeless tobacco: Never Used  . Alcohol use Yes      Family History Family History  Problem Relation Age of Onset  . Diabetes Father   . Hypertension Father   . Hypertension Other   . Thyroid disease Neg Hx      No Known Allergies   REVIEW OF SYSTEMS (Negative unless checked)  Constitutional: Weight loss  Fever  Chills Cardiac: Chest pain   Chest pressure   Palpitations   Shortness of breath when laying flat   Shortness of breath at rest   Shortness of breath with exertion. Vascular:  Pain in legs with walking   Pain in legs at rest   Pain in legs when laying flat   Claudication   Pain in feet when walking  Pain in feet at rest  Pain in feet when laying flat   History of DVT   Phlebitis   Swelling in legs   Varicose veins   Non-healing ulcers Pulmonary:   Uses home oxygen   Productive cough     Hemoptysis   Wheeze  COPD   Asthma Neurologic:  Dizziness  Blackouts   Seizures   History of stroke   History of TIA  Aphasia   Temporary blindness   Dysphagia   Weakness or numbness in arms   Weakness or numbness in legs Musculoskeletal:  Arthritis   Joint swelling   Joint pain   Low back pain Hematologic:  Easy bruising  Easy bleeding   Hypercoagulable state   Anemic   Gastrointestinal:  Blood in stool   Vomiting blood  Gastroesophageal reflux/heartburn   Abdominal pain Genitourinary:  Chronic kidney disease   Difficult urination  Frequent urination  Burning with urination   Hematuria Skin:   Rashes   Ulcers   Wounds Psychological:  History of anxiety    History of major depression.  Physical Examination  BP 113/67 (BP Location: Left Arm)   Pulse 71   Resp 16   Ht  (1.626 m)   Wt 159 lb (72.1 kg)   BMI 27.29 kg/m  Gen:  WD/WN, NAD Head: [place]/AT, No temporalis wasting. Ear/Nose/Throat: Hearing grossly intact, nares w/o erythema or drainage, trachea midline Eyes: Conjunctiva clear. Sclera non-icteric Neck: Supple.  No JVD.  Pulmonary:  Good air movement, no use of accessory muscles.  Cardiac: RRR, normal S1, S2 Vascular:  Vessel Right Left  Radial Palpable Palpable                                    Musculoskeletal: M/S 5/5 throughout.  No deformity or atrophy.  Neurologic: Sensation grossly intact in extremities.  Symmetrical.  Speech is fluent.  Psychiatric: Judgment intact, Mood & affect appropriate for pt's clinical situation. Dermatologic: No rashes or ulcers noted.  No cellulitis or open wounds.       Labs Recent Results (from the past 2160 hour(s))  T4, free     Status: None   Collection Time: 05/17/16 11:44 AM  Result Value Ref Range   Free T4 0.65 0.60 - 1.60 ng/dL    Comment: Specimens from patients who are undergoing biotin therapy and /or ingesting biotin supplements may contain high levels of biotin.  The higher biotin concentration in these specimens interferes with this Free T4 assay.  Specimens that contain high levels  of biotin may cause false high results for this Free T4 assay.  Please interpret results in light of the total clinical presentation of the patient.    TSH     Status: None   Collection Time: 05/17/16 11:44 AM  Result Value Ref Range   TSH 2.23 0.35 - 4.50 uIU/mL  Hemoglobin A1c     Status: None   Collection Time: 05/17/16 11:44 AM  Result Value Ref Range   Hgb A1c MFr Bld 5.2 4.6 - 6.5 %    Comment: Glycemic Control Guidelines for People with Diabetes:Non Diabetic:  <6%Goal of Therapy: <7%Additional  Action Suggested:  >8%     Radiology Ct Angio Neck W Or Wo Contrast  Result Date: 07/24/2016 CLINICAL DATA:  Continued surveillance carotid artery dissection. EXAM: CT ANGIOGRAPHY NECK TECHNIQUE: Multidetector CT imaging of the neck was performed using the standard protocol during bolus administration of intravenous contrast. Multiplanar CT image reconstructions and MIPs were obtained to evaluate the vascular anatomy. Carotid stenosis measurements (when applicable) are obtained utilizing NASCET criteria, using the distal internal carotid diameter as the denominator. CONTRAST:  Isovue 370, 75 mL. COMPARISON:  04/12/2016.  FINDINGS: Aortic arch: Standard branching. Imaged portion shows no evidence of aneurysm or dissection. No significant stenosis of the major arch vessel origins. Right carotid system: Unremarkable. No evidence of dissection, stenosis (50% or greater) or occlusion. Left carotid system: Widely patent LEFT common carotid artery. No significant disease at the origin of the LEFT internal carotid artery. Previous mid cervical ICA dissection has resolved, now with normal luminal diameter. A small pseudoaneurysm remains at the skull base, 3 x 3 x 4 mm in size, projecting laterally. Vertebral arteries: LEFT vertebral dominant. No evidence of dissection, stenosis (50% or greater) or occlusion. Skeleton: Spondylosis C5-6 is stable. Other neck: No mass or lymph node enlargement. Upper chest: Lung apices clear. IMPRESSION: LEFT ICA dissection has resolved. Residual skullbase LEFT ICA pseudoaneurysm, 3 x 3 x 4 mm. Continued surveillance is warranted. Electronically Signed   By: Elsie Stain M.D.   On: 07/24/2016 08:46      Assessment/Plan  Carotid artery dissection [facility]) She has had a repeat CT angiogram which I have independently reviewed today. This demonstrates resolution of her previous carotid artery dissection with no residual luminal narrowing. There is a small residual pseudoaneurysm of about  3 mm which is much smaller than the 8-9 mm pseudoaneurysm that was present previously. This is a marked improvement but not complete resolution. It would be a little bit early for a complete resolution. I think at this point, she can start to increase her activities. She will continue the aspirin and Plavix at this point. We will repeat a CT angiogram in about 4 months, and if the dissection is resolved with no residual narrowing in the pseudoaneurysm remains very small, we could consider removing the Plavix at some point. She will contact our office with any problems in the interim.    Festus Barren, MD  07/25/2016 10:01 AM    This note was created with Dragon medical transcription system.  Any errors from dictation are purely unintentional

## 2016-07-25 NOTE — Assessment & Plan Note (Signed)
She has had a repeat CT angiogram which I have independently reviewed today. This demonstrates resolution of her previous carotid artery dissection with no residual luminal narrowing. There is a small residual pseudoaneurysm of about 3 mm which is much smaller than the 8-9 mm pseudoaneurysm that was present previously. This is a marked improvement but not complete resolution. It would be a little bit early for a complete resolution. I think at this point, she can start to increase her activities. She will continue the aspirin and Plavix at this point. We will repeat a CT angiogram in about 4 months, and if the dissection is resolved with no residual narrowing in the pseudoaneurysm remains very small, we could consider removing the Plavix at some point. She will contact our office with any problems in the interim.

## 2016-07-27 ENCOUNTER — Ambulatory Visit (INDEPENDENT_AMBULATORY_CARE_PROVIDER_SITE_OTHER): Payer: Self-pay | Admitting: Vascular Surgery

## 2016-11-23 ENCOUNTER — Encounter: Payer: Self-pay | Admitting: Obstetrics & Gynecology

## 2016-11-23 ENCOUNTER — Ambulatory Visit (INDEPENDENT_AMBULATORY_CARE_PROVIDER_SITE_OTHER): Payer: Self-pay | Admitting: Obstetrics & Gynecology

## 2016-11-23 VITALS — BP 126/80 | Ht 64.0 in | Wt 160.0 lb

## 2016-11-23 DIAGNOSIS — Z01419 Encounter for gynecological examination (general) (routine) without abnormal findings: Secondary | ICD-10-CM

## 2016-11-23 DIAGNOSIS — F329 Major depressive disorder, single episode, unspecified: Secondary | ICD-10-CM

## 2016-11-23 DIAGNOSIS — Z9851 Tubal ligation status: Secondary | ICD-10-CM

## 2016-11-23 DIAGNOSIS — Z1151 Encounter for screening for human papillomavirus (HPV): Secondary | ICD-10-CM

## 2016-11-23 DIAGNOSIS — N921 Excessive and frequent menstruation with irregular cycle: Secondary | ICD-10-CM

## 2016-11-23 DIAGNOSIS — F32A Depression, unspecified: Secondary | ICD-10-CM

## 2016-11-23 LAB — COMPREHENSIVE METABOLIC PANEL
ALBUMIN: 4.5 g/dL (ref 3.6–5.1)
ALT: 12 U/L (ref 6–29)
AST: 13 U/L (ref 10–30)
Alkaline Phosphatase: 62 U/L (ref 33–115)
BUN: 11 mg/dL (ref 7–25)
CALCIUM: 9.5 mg/dL (ref 8.6–10.2)
CO2: 23 mmol/L (ref 20–31)
Chloride: 103 mmol/L (ref 98–110)
Creat: 0.78 mg/dL (ref 0.50–1.10)
GLUCOSE: 107 mg/dL — AB (ref 65–99)
Potassium: 4.6 mmol/L (ref 3.5–5.3)
Sodium: 139 mmol/L (ref 135–146)
Total Bilirubin: 0.4 mg/dL (ref 0.2–1.2)
Total Protein: 6.9 g/dL (ref 6.1–8.1)

## 2016-11-23 LAB — CBC
HEMATOCRIT: 42.2 % (ref 35.0–45.0)
Hemoglobin: 14.4 g/dL (ref 11.7–15.5)
MCH: 31.5 pg (ref 27.0–33.0)
MCHC: 34.1 g/dL (ref 32.0–36.0)
MCV: 92.3 fL (ref 80.0–100.0)
MPV: 10.1 fL (ref 7.5–12.5)
Platelets: 247 10*3/uL (ref 140–400)
RBC: 4.57 MIL/uL (ref 3.80–5.10)
RDW: 12.7 % (ref 11.0–15.0)
WBC: 5.6 10*3/uL (ref 3.8–10.8)

## 2016-11-23 LAB — TSH: TSH: 3.22 m[IU]/L

## 2016-11-23 NOTE — Progress Notes (Signed)
Krista Welch 01-Oct-1980 [id number]   History:    36 y.o. G2P2  Married.  Youngest 2 1/38 yo.  S/P Tubal Ligation  RP:  Established patient presenting for annual gyn exam   HPI:  Had a Novasure Endometrial Ablation at [facility] [employer] office last year.  Since then, menses very light, but q2-4 weeks.  Still on Plavix for a Carotid artery dissection.  No pelvic pain.  No vaginal d/c.  Breasts wnl.  Mictions/BMs wnl. Works in [employer].  Feels that her moods are more depressed than usual.  No suicidal ideation. Hypothyroidism on Levothyroxine.  Didn't consult yet.  Says that her father was an alcoholic with a narcissistic personality.  Past medical history,surgical history, family history and social history were all reviewed and documented in the EPIC chart.  Gynecologic History No LMP recorded. Patient has had an ablation. Contraception: tubal ligation Last Pap: 2017. Results were: normal Last mammogram: None.  Obstetric History OB History  Gravida Para Term Preterm AB Living  '5 3 3 ' 0 2 2  SAB TAB Ectopic Multiple Live Births  2 0 0 0 2    # Outcome Date GA Lbr Len/2nd Weight Sex Delivery Anes PTL Lv  5 Term 06/05/14 [redacted]w[redacted]d 9 lb 2.7 oz (4.16 kg) F CS-LTranv Spinal  LIV  4 Term 01/23/12 330w0d1:55 / 00:18 8 lb 0.8 oz (3.65 kg) M VBAC EPI  LIV  3 SAB           2 SAB           1 Term                ROS: A ROS was performed and pertinent positives and negatives are included in the history.  GENERAL: No fevers or chills. HEENT: No change in vision, no earache, sore throat or sinus congestion. NECK: No pain or stiffness. CARDIOVASCULAR: No chest pain or pressure. No palpitations. PULMONARY: No shortness of breath, cough or wheeze. GASTROINTESTINAL: No abdominal pain, nausea, vomiting or diarrhea, melena or bright red blood per rectum. GENITOURINARY: No urinary frequency, urgency, hesitancy or dysuria. MUSCULOSKELETAL: No joint or muscle pain, no back pain, no recent  trauma. DERMATOLOGIC: No rash, no itching, no lesions. ENDOCRINE: No polyuria, polydipsia, no heat or cold intolerance. No recent change in weight. HEMATOLOGICAL: No anemia or easy bruising or bleeding. NEUROLOGIC: No headache, seizures, numbness, tingling or weakness. PSYCHIATRIC: No depression, no loss of interest in normal activity or change in sleep pattern.     Exam:   BP 126/80   Ht '5\' 4"'  (1.626 m)   Wt 160 lb (72.6 kg)   BMI 27.46 kg/m   Body mass index is 27.46 kg/m.  General appearance : Well developed well nourished female. No acute distress HEENT: Eyes: no retinal hemorrhage or exudates,  Neck supple, trachea midline, no carotid bruits, no thyroidmegaly Lungs: Clear to auscultation, no rhonchi or wheezes, or rib retractions  Heart: Regular rate and rhythm, no murmurs or gallops Breast:Examined in sitting and supine position were symmetrical in appearance, no palpable masses or tenderness,  no skin retraction, no nipple inversion, no nipple discharge, no skin discoloration, no axillary or supraclavicular lymphadenopathy Abdomen: no palpable masses or tenderness, no rebound or guarding Extremities: no edema or skin discoloration or tenderness  Pelvic: Vulva normal  Bartholin, Urethra, Skene Glands: Within normal limits             Vagina: No gross lesions or discharge  Cervix: No  gross lesions or discharge.  Pap reflex done.  Uterus  AV, normal size, shape and consistency, non-tender and mobile  Adnexa  Without masses or tenderness  Anus and perineum  normal   Assessment/Plan:  36 y.o. female for annual exam   1. Encounter for routine gynecological examination with Papanicolaou smear of cervix Normal gyn exam.  Pap reflex done.  Breasts wnl.  2. Tubal ligation status   3. Mood disorder of depressed type No suicidal ideation.  Mother of 2, youngest 44 1/2 yo.  Working.  R/O hormonal factors.  Recommended Psychologic consultation. - TSH+Prl+FSH+TestT+LH+DHEA S... -  CBC - Comp Met (CMET) - Vitamin D 1,25 dihydroxy - Hemoglobin A1c  4. Metrorrhagia W-up as above   Counseling on above issues >50% x 15 minutes  [name], 11:41 AM 11/23/2016

## 2016-11-23 NOTE — Patient Instructions (Signed)
1. Encounter for routine gynecological examination with Papanicolaou smear of cervix Normal gyn exam.  Pap reflex done.  Breasts wnl.  2. Tubal ligation status   3. Mood disorder of depressed type No suicidal ideation.  Mother of 2, youngest 4 1/38 yo.  Working.  R/O hormonal factors.  Recommended Psychologic consultation. - TSH+Prl+FSH+TestT+LH+DHEA S... - CBC - Comp Met (CMET) - Vitamin D 1,25 dihydroxy - Hemoglobin A1c  4. Metrorrhagia W-up as above   Krista Welch, it was a pleasure to see you today!  I will inform you of your results as soon as available.

## 2016-11-24 LAB — DHEA-SULFATE: DHEA SO4: 339 ug/dL — AB (ref 23–266)

## 2016-11-24 LAB — HEMOGLOBIN A1C
Hgb A1c MFr Bld: 5.1 % (ref <5.7)
Mean Plasma Glucose: 100 mg/dL

## 2016-11-24 LAB — PROLACTIN: PROLACTIN: 7.3 ng/mL

## 2016-11-24 LAB — LUTEINIZING HORMONE: LH: 9.5 m[IU]/mL

## 2016-11-24 LAB — FOLLICLE STIMULATING HORMONE: FSH: 9.4 m[IU]/mL

## 2016-11-27 LAB — PAP, TP IMAGING W/ HPV RNA, RFLX HPV TYPE 16,18/45: HPV mRNA, High Risk: NOT DETECTED

## 2016-11-27 LAB — VITAMIN D 1,25 DIHYDROXY
VITAMIN D3 1, 25 (OH): 59 pg/mL
Vitamin D 1, 25 (OH)2 Total: 59 pg/mL (ref 18–72)

## 2016-11-28 LAB — TESTOS,TOTAL,FREE AND SHBG (FEMALE)
Sex Hormone Binding Glob.: 82 nmol/L (ref 17–124)
Testosterone, Free: 2.2 pg/mL (ref 0.1–6.4)
Testosterone,Total,LC/MS/MS: 29 ng/dL (ref 2–45)

## 2016-12-01 ENCOUNTER — Ambulatory Visit
Admission: RE | Admit: 2016-12-01 | Discharge: 2016-12-01 | Disposition: A | Discharge status: Home / Self Care | Payer: Self-pay | Source: Ambulatory Visit | Attending: Vascular Surgery | Admitting: Vascular Surgery

## 2016-12-01 DIAGNOSIS — I7771 Dissection of carotid artery: Secondary | ICD-10-CM | POA: Insufficient documentation

## 2016-12-01 DIAGNOSIS — I720 Aneurysm of carotid artery: Secondary | ICD-10-CM | POA: Insufficient documentation

## 2016-12-01 MED ORDER — IOPAMIDOL (ISOVUE-370) INJECTION 76%
75.0000 mL | Freq: Once | INTRAVENOUS | Status: AC | PRN
  Administered 2016-12-01: 75 mL via INTRAVENOUS

## 2016-12-12 ENCOUNTER — Encounter (INDEPENDENT_AMBULATORY_CARE_PROVIDER_SITE_OTHER): Payer: Self-pay | Admitting: Vascular Surgery

## 2016-12-12 ENCOUNTER — Ambulatory Visit (INDEPENDENT_AMBULATORY_CARE_PROVIDER_SITE_OTHER): Payer: Self-pay | Admitting: Vascular Surgery

## 2016-12-12 VITALS — BP 115/71 | HR 72 | Resp 16 | Ht 64.0 in | Wt 160.0 lb

## 2016-12-12 DIAGNOSIS — I7771 Dissection of carotid artery: Secondary | ICD-10-CM

## 2016-12-12 DIAGNOSIS — I720 Aneurysm of carotid artery: Secondary | ICD-10-CM

## 2016-12-12 DIAGNOSIS — I671 Cerebral aneurysm, nonruptured: Secondary | ICD-10-CM

## 2016-12-12 NOTE — Assessment & Plan Note (Signed)
She had a recent CT scan which I have independently reviewed. Her carotid dissection has healed and her small pseudoaneurysm at the base of the skull is stable to slightly decreased in size now measuring 2-3 mm. This has markedly decreased in size and is not appropriate for intervention at this point. Okay to stop Plavix this point. Would continue an aspirin indefinitely. We will plan to reimage her in about 12 months with CT angiogram.

## 2016-12-12 NOTE — Assessment & Plan Note (Signed)
She had a recent CT scan which I have independently reviewed. Her carotid dissection has healed and her small pseudoaneurysm at the base of the skull is stable to slightly decreased in size now measuring 2-3 mm. Okay to stop Plavix this point. Would continue an aspirin indefinitely. We will plan to reimage her in about 12 months with CT angiogram.

## 2016-12-12 NOTE — Progress Notes (Signed)
MRN : [id number]  Krista Welch is a 36 y.o. (04/16/1981) female who presents with chief complaint of  Chief Complaint  Patient presents with  . Follow-up    CT Results  .  History of Present Illness: Patient returns in follow-up of the left carotid artery dissection and small pseudoaneurysm. She says that the tinnitus in her left ear has essentially resolved. She has occasional intermittent dizziness but it is not nearly as severe as it was. She denies other focal neurologic symptoms such as arm or leg weakness or numbness, speech or swallowing difficulty, or temporary monocular blindness. She had a recent CT scan which I have independently reviewed. Her carotid dissection has healed and her small pseudoaneurysm at the base of the skull is stable to slightly decreased in size now measuring 2-3 mm.  Current Outpatient Prescriptions  Medication Sig Dispense Refill  . albuterol (PROVENTIL HFA;VENTOLIN HFA) 108 (90 Base) MCG/ACT inhaler Inhale into the lungs.    Marland Kitchen aspirin EC 81 MG tablet Take 81 mg by mouth daily.    . cetirizine (ZYRTEC) 10 MG tablet Take 10 mg by mouth daily.    . clopidogrel (PLAVIX) 75 MG tablet Take 75 mg by mouth daily.    Marland Kitchen levothyroxine (SYNTHROID, LEVOTHROID) 25 MCG tablet Take 1 tablet (25 mcg total) by mouth daily before breakfast. 90 tablet 3  . Multiple Vitamin (MULTI-VITAMINS) TABS Take by mouth.    . Probiotic Product (PROBIOTIC-10 PO) Take by mouth.     No current facility-administered medications for this visit.     Past Medical History:  Diagnosis Date  . Active labor 01/23/2012  . Asthma    allergy induced  . Hx of cesarean section complicating pregnancy 01/23/2012  . Hypothyroid   . Hypothyroidism complicating pregnancy 01/23/2012  . Infertility, female   . Postpartum care following repeat cesarean delivery with BTL (2/12) 06/06/2014  . Sinusitis, acute maxillary   . Stroke [facility])     Past Surgical History:  Procedure Laterality Date  .  CESAREAN SECTION    . CESAREAN SECTION N/A 06/05/2014   Procedure: CESAREAN SECTION;  Surgeon: Genia Del, MD;  Location: [facility];  Service: Obstetrics;  Laterality: N/A;  . HAND SURGERY    . WISDOM TOOTH EXTRACTION       Social History     Social History  Substance Use Topics  . Smoking status: Never Smoker  . Smokeless tobacco: Never Used  . Alcohol use Yes      Family History      Family History  Problem Relation Age of Onset  . Diabetes Father   . Hypertension Father   . Hypertension Other   . Thyroid disease Neg Hx      No Known Allergies   REVIEW OF SYSTEMS (Negative unless checked)  Constitutional: [] Weight loss  [] Fever  [] Chills Cardiac: [] Chest pain   [] Chest pressure   [] Palpitations   [] Shortness of breath when laying flat   [] Shortness of breath at rest   [] Shortness of breath with exertion. Vascular:  [] Pain in legs with walking   [] Pain in legs at rest   [] Pain in legs when laying flat   [] Claudication   [] Pain in feet when walking  [] Pain in feet at rest  [] Pain in feet when laying flat   [] History of DVT   [] Phlebitis   [] Swelling in legs   [] Varicose veins   [] Non-healing ulcers Pulmonary:   [] Uses home oxygen   [] Productive cough   []   Hemoptysis   [] Wheeze  [] COPD   [] Asthma Neurologic:  [x] Dizziness  [] Blackouts   [] Seizures   [] History of stroke   [] History of TIA  [] Aphasia   [] Temporary blindness   [] Dysphagia   [] Weakness or numbness in arms   [] Weakness or numbness in legs Musculoskeletal:  [] Arthritis   [] Joint swelling   [] Joint pain   [] Low back pain Hematologic:  [] Easy bruising  [] Easy bleeding   [] Hypercoagulable state   [] Anemic   Gastrointestinal:  [] Blood in stool   [] Vomiting blood  [] Gastroesophageal reflux/heartburn   [] Abdominal pain Genitourinary:  [] Chronic kidney disease   [] Difficult urination  [] Frequent urination  [] Burning with urination   [] Hematuria Skin:  [] Rashes   [] Ulcers   [] Wounds Psychological:   [] History of anxiety   []  History of major depression.   Physical Examination  Vitals:   12/12/16 0929  BP: 115/71  Pulse: 72  Resp: 16  Weight: 160 lb (72.6 kg)  Height: 5\' 4"  (1.626 m)   Body mass index is 27.46 kg/m. Gen:  WD/WN, NAD Head: [place]/AT, No temporalis wasting. Ear/Nose/Throat: Hearing grossly intact, nares w/o erythema or drainage, trachea midline Eyes: Conjunctiva clear. Sclera non-icteric Neck: Supple.  No bruit or JVD.  Pulmonary:  Good air movement, equal and clear to auscultation bilaterally.  Cardiac: RRR, normal S1, S2, no Murmurs, rubs or gallops. Vascular:  Vessel Right Left  Radial Palpable Palpable                                    Musculoskeletal: M/S 5/5 throughout.  No deformity or atrophy. Neurologic: CN 2-12 intact. Sensation grossly intact in extremities.  Symmetrical.  Speech is fluent. Motor exam as listed above. Psychiatric: Judgment intact, Mood & affect appropriate for pt's clinical situation. Dermatologic: No rashes or ulcers noted.  No cellulitis or open wounds.      CBC Lab Results  Component Value Date   WBC 5.6 11/23/2016   HGB 14.4 11/23/2016   HCT 42.2 11/23/2016   MCV 92.3 11/23/2016   PLT 247 11/23/2016    BMET    Component Value Date/Time   NA 139 11/23/2016 1219   K 4.6 11/23/2016 1219   CL 103 11/23/2016 1219   CO2 23 11/23/2016 1219   GLUCOSE 107 (H) 11/23/2016 1219   BUN 11 11/23/2016 1219   CREATININE 0.78 11/23/2016 1219   CALCIUM 9.5 11/23/2016 1219   Estimated Creatinine Clearance: 94.1 mL/min (by C-G formula based on SCr of 0.78 mg/dL).  COAG No results found for: INR, PROTIME  Radiology Ct Angio Neck W/cm &/or Wo/cm  Result Date: 12/01/2016 CLINICAL DATA:  Carotid artery dissection and pseudoaneurysm followup EXAM: CT ANGIOGRAPHY NECK TECHNIQUE: Multidetector CT imaging of the neck was performed using the standard protocol during bolus administration of intravenous contrast. Multiplanar CT  image reconstructions and MIPs were obtained to evaluate the vascular anatomy. Carotid stenosis measurements (when applicable) are obtained utilizing NASCET criteria, using the distal internal carotid diameter as the denominator. CONTRAST:  75 mL Isovue 370 IV COMPARISON:  CTA 04/12/2016, 07/24/2016 FINDINGS: Aortic arch: Normal aortic arch and proximal great vessels. Three-vessel branching pattern. Right carotid system: Normal right carotid without dissection or stenosis Left carotid system: Left carotid bifurcation normal. Negative for atherosclerotic disease. The previous dissection was in the distal left cervical internal carotid artery. This has healed. The small left pseudoaneurysm just below the skullbase is slightly smaller on today's  study compared with the prior studies. This measures approximately 3 x 3 mm. No significant luminal stenosis. Vertebral arteries: Normal bilaterally Skeleton: No significant skeletal abnormality. Thoracic levoscoliosis. Other neck: Negative Upper chest: None IMPRESSION: Dissection of the distal left cervical internal carotid artery has healed. Small pseudoaneurysm at the site of previous dissection is slightly smaller compared with the prior study, now measuring 3 x 3 mm Electronically Signed   By: Marlan Palau M.D.   On: 12/01/2016 16:56      Assessment/Plan Carotid artery dissection [facility]) She had a recent CT scan which I have independently reviewed. Her carotid dissection has healed and her small pseudoaneurysm at the base of the skull is stable to slightly decreased in size now measuring 2-3 mm. Okay to stop Plavix this point. Would continue an aspirin indefinitely. We will plan to reimage her in about 12 months with CT angiogram.  Aneurysm, carotid artery, internal She had a recent CT scan which I have independently reviewed. Her carotid dissection has healed and her small pseudoaneurysm at the base of the skull is stable to slightly decreased in size now  measuring 2-3 mm. This has markedly decreased in size and is not appropriate for intervention at this point. Okay to stop Plavix this point. Would continue an aspirin indefinitely. We will plan to reimage her in about 12 months with CT angiogram.    Festus Barren, MD  12/12/2016 9:59 AM    This note was created with Dragon medical transcription system.  Any errors from dictation are purely unintentional

## 2017-01-02 ENCOUNTER — Other Ambulatory Visit: Payer: Self-pay

## 2017-01-02 ENCOUNTER — Telehealth: Payer: Self-pay | Admitting: Endocrinology

## 2017-01-02 MED ORDER — LEVOTHYROXINE SODIUM 25 MCG PO TABS: 25.0000 ug | ORAL_TABLET | Freq: Every day | ORAL | 3 refills | Status: DC

## 2017-01-02 NOTE — Telephone Encounter (Signed)
Please call the levothyroxine to the the [facility] in [place] asap please address is [address]

## 2017-01-02 NOTE — Telephone Encounter (Signed)
MEDICATION: Levothyroxine 25MCG  PHARMACY:  [facility] [place] - [address] P: [phone]   IS THIS A 90 DAY SUPPLY : yes   IS PATIENT OUT OF MEDICATION: yes  IF NOT; HOW MUCH IS LEFT:   LAST APPOINTMENT DATE: 04/05/2016  NEXT APPOINTMENT DATE: 04/04/2017  OTHER COMMENTS: [facility] that [facility] usually gets it from has no power[place] [facility]

## 2017-01-02 NOTE — Telephone Encounter (Signed)
Prescription sent

## 2017-04-04 ENCOUNTER — Ambulatory Visit: Payer: Self-pay | Admitting: Endocrinology

## 2017-04-11 ENCOUNTER — Ambulatory Visit (INDEPENDENT_AMBULATORY_CARE_PROVIDER_SITE_OTHER): Payer: Self-pay | Admitting: Obstetrics & Gynecology

## 2017-04-11 ENCOUNTER — Encounter: Payer: Self-pay | Admitting: Obstetrics & Gynecology

## 2017-04-11 VITALS — BP 106/68

## 2017-04-11 DIAGNOSIS — N898 Other specified noninflammatory disorders of vagina: Secondary | ICD-10-CM

## 2017-04-11 LAB — WET PREP FOR TRICH, YEAST, CLUE

## 2017-04-11 MED ORDER — FLUCONAZOLE 150 MG PO TABS: 150.0000 mg | ORAL_TABLET | Freq: Once | ORAL | 2 refills | Status: AC

## 2017-04-11 NOTE — Progress Notes (Signed)
    Krista RochJeanna D Welch 07-Nov-1980 [id number]        36 y.o.  [id number] Married  RP:  Vaginal itching with increased discharge  HPI:  Was on ABTx for a Sinus infection 2 weeks ago.  Since then, has had an increase in her vaginal d/c with odor, discomfort and itching.  No pelvic pain.  No UTI Sx.  No fever.  Annual gynecologic exam done in August 2018.    Past medical history,surgical history, problem list, medications, allergies, family history and social history were all reviewed and documented in the EPIC chart.  Directed ROS with pertinent positives and negatives documented in the history of present illness/assessment and plan.  Exam:  Vitals:   04/11/17 1435  BP: 106/68   General appearance:  Normal  Gyn exam:  Vulva normal.  Speculum:  Cervix/Vagina normal.  Very mild increase in vaginal d/c.  Wet prep done.  Wet prep:  Negative.   Assessment/Plan:  36 y.o. [id number]   1. Vaginal odor Wet prep negative.  Will observe.  Recommend probiotic tablet vaginally once a week as needed. - WET PREP FOR TRICH, YEAST, CLUE  2. Vaginal itching Wet prep negative.  Will observe or may take fluconazole 150 mg tablet x1 if symptoms persist or worsen, as needed. - WET PREP FOR TRICH, YEAST, CLUE  Other orders - fluconazole (DIFLUCAN) 150 MG tablet; Take 1 tablet (150 mg total) by mouth once for 1 dose.  Counseling on above issues more than 50% for 15 minutes.  Genia DelMarie-Lyne Tauren Delbuono MD, 3:03 PM 04/11/2017

## 2017-04-15 ENCOUNTER — Encounter: Payer: Self-pay | Admitting: Obstetrics & Gynecology

## 2017-04-15 NOTE — Patient Instructions (Signed)
1. Vaginal odor Wet prep negative.  Will observe.  Recommend probiotic tablet vaginally once a week as needed. - WET PREP FOR TRICH, YEAST, CLUE  2. Vaginal itching Wet prep negative.  Will observe or may take fluconazole 150 mg tablet x1 if symptoms persist or worsen, as needed. - WET PREP FOR TRICH, YEAST, CLUE  Other orders - fluconazole (DIFLUCAN) 150 MG tablet; Take 1 tablet (150 mg total) by mouth once for 1 dose.  Almira CoasterGina, good seeing you today!

## 2018-01-08 ENCOUNTER — Telehealth: Payer: Self-pay | Admitting: Endocrinology

## 2018-01-08 NOTE — Telephone Encounter (Signed)
Pt needs refills on synthyroid generic 25 mcg  Please call into [facility] [address], [place], [place] # [phone]

## 2018-01-09 NOTE — Telephone Encounter (Signed)
Pt [facility] ov [facility]/05/16 [facility]

## 2018-01-09 NOTE — Telephone Encounter (Signed)
Please refill x 1 Ov is due  

## 2018-01-10 ENCOUNTER — Other Ambulatory Visit: Payer: Self-pay

## 2018-01-10 MED ORDER — LEVOTHYROXINE SODIUM 25 MCG PO TABS: 25.0000 ug | ORAL_TABLET | Freq: Every day | ORAL | 0 refills | Status: AC

## 2018-01-10 NOTE — Telephone Encounter (Signed)
sent 

## 2018-01-28 IMAGING — CT CT ANGIO NECK
2 of 3 series · 8 of 14 positions shown · IV contrast (isovue)
Comparison: CTA 04/12/2016, 07/24/2016

CLINICAL DATA: Carotid artery dissection and pseudoaneurysm
followup



[Series 4: cta neck · axial · 0.47mm/px · z∈[-268,-186]mm · 2 of 125 slices shown]
[im 42/125  soft-tissue]
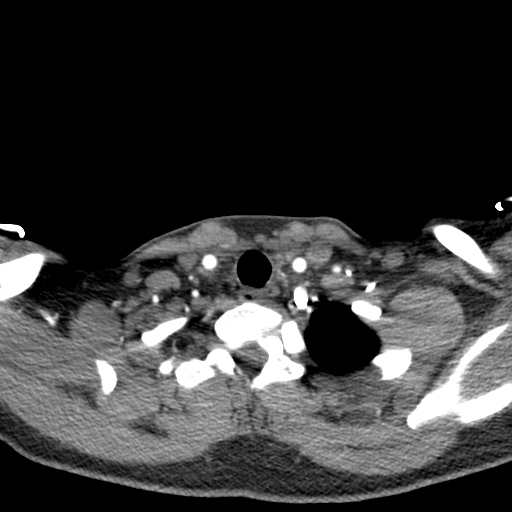
[im 83/125  soft-tissue]
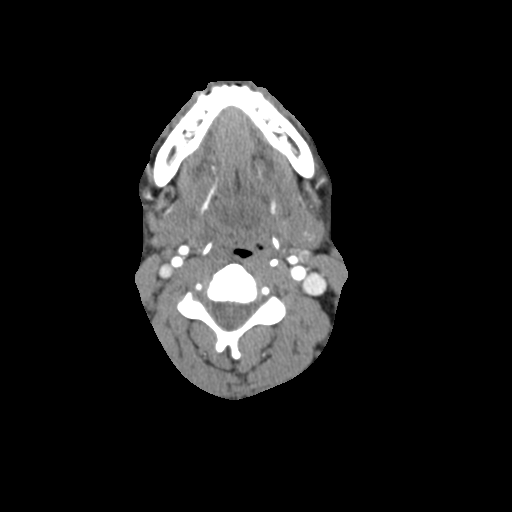

[Series 6: ax thin · axial · 0.39mm/px · z∈[-314,-136]mm · 6 of 250 slices shown]
[im 36/250  soft-tissue]
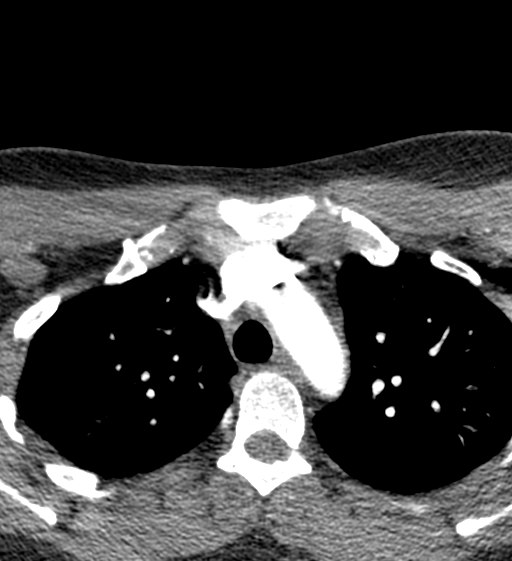
[im 72/250  bone]
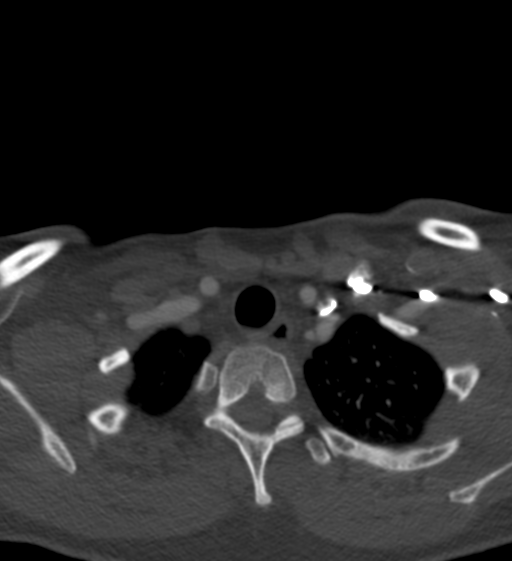
[im 107/250  soft-tissue]
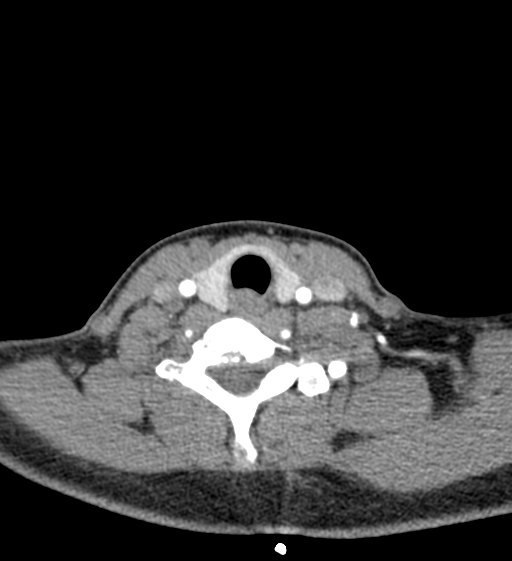
[im 143/250  bone]
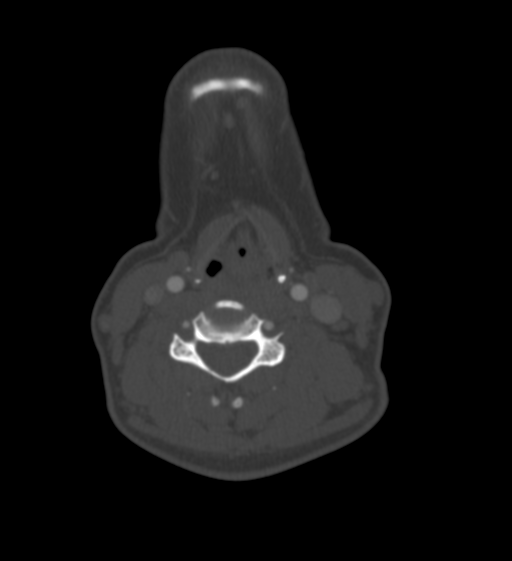
[im 178/250  soft-tissue]
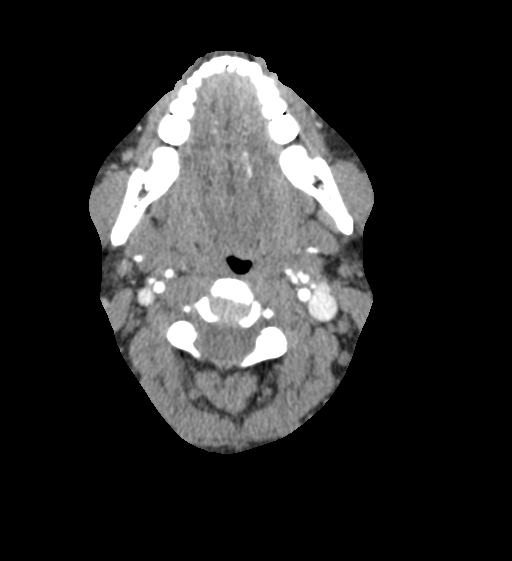
[im 214/250  bone]
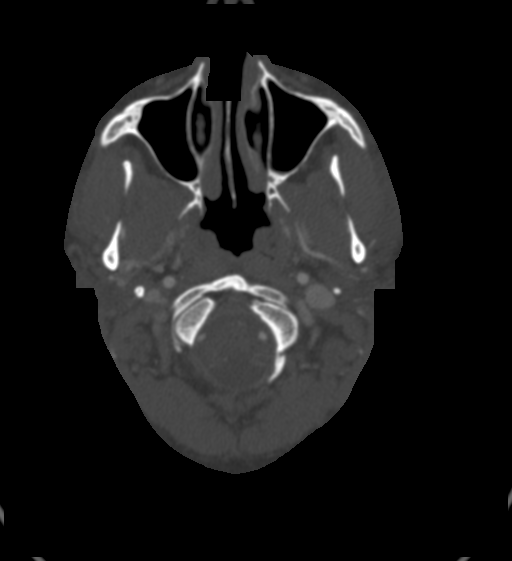

[8 of 14 positions shown; findings below may reference images not displayed]

FINDINGS: Aortic arch: Normal aortic arch and proximal great vessels.
Three-vessel branching pattern.

Right carotid system: Normal right carotid without dissection or
stenosis

Left carotid system: Left carotid bifurcation normal. Negative for
atherosclerotic disease. The previous dissection was in the distal
left cervical internal carotid artery. This has healed. The small
left pseudoaneurysm just below the skullbase is slightly smaller on
today's study compared with the prior studies. This measures
approximately 3 x 3 mm. No significant luminal stenosis.

Vertebral arteries: Normal bilaterally

Skeleton: No significant skeletal abnormality. Thoracic
levoscoliosis.

Other neck: Negative

Upper chest: None
IMPRESSION: Dissection of the distal left cervical internal carotid artery has
healed. Small pseudoaneurysm at the site of previous dissection is
slightly smaller compared with the prior study, now measuring 3 x 3
mm

## 2018-02-06 ENCOUNTER — Other Ambulatory Visit: Payer: Self-pay | Admitting: Endocrinology
